# Patient Record
Sex: Female | Born: 1960 | Race: White | Hispanic: No | Marital: Married | Smoking: Former smoker
Health system: Southern US, Community
[De-identification: ages and names within clinical notes are randomized; demographics above are authoritative.]

## PROBLEM LIST (undated history)

## (undated) DIAGNOSIS — I10 Essential (primary) hypertension: Secondary | ICD-10-CM

## (undated) DIAGNOSIS — E079 Disorder of thyroid, unspecified: Secondary | ICD-10-CM

## (undated) DIAGNOSIS — K219 Gastro-esophageal reflux disease without esophagitis: Secondary | ICD-10-CM

## (undated) DIAGNOSIS — D649 Anemia, unspecified: Secondary | ICD-10-CM

## (undated) DIAGNOSIS — N6019 Diffuse cystic mastopathy of unspecified breast: Secondary | ICD-10-CM

## (undated) DIAGNOSIS — M8580 Other specified disorders of bone density and structure, unspecified site: Secondary | ICD-10-CM

## (undated) DIAGNOSIS — H269 Unspecified cataract: Secondary | ICD-10-CM

## (undated) DIAGNOSIS — T7840XA Allergy, unspecified, initial encounter: Secondary | ICD-10-CM

## (undated) DIAGNOSIS — M5090 Cervical disc disorder, unspecified, unspecified cervical region: Secondary | ICD-10-CM

## (undated) DIAGNOSIS — K635 Polyp of colon: Secondary | ICD-10-CM

## (undated) HISTORY — DX: Cervical disc disorder, unspecified, unspecified cervical region: M50.90

## (undated) HISTORY — DX: Gastro-esophageal reflux disease without esophagitis: K21.9

## (undated) HISTORY — DX: Disorder of thyroid, unspecified: E07.9

## (undated) HISTORY — DX: Anemia, unspecified: D64.9

## (undated) HISTORY — PX: CATARACT EXTRACTION, BILATERAL: SHX1313

## (undated) HISTORY — DX: Diffuse cystic mastopathy of unspecified breast: N60.19

## (undated) HISTORY — PX: COLONOSCOPY: SHX174

## (undated) HISTORY — DX: Polyp of colon: K63.5

## (undated) HISTORY — PX: MANDIBLE SURGERY: SHX707

## (undated) HISTORY — DX: Other specified disorders of bone density and structure, unspecified site: M85.80

## (undated) HISTORY — DX: Essential (primary) hypertension: I10

## (undated) HISTORY — DX: Allergy, unspecified, initial encounter: T78.40XA

## (undated) HISTORY — DX: Unspecified cataract: H26.9

## (undated) HISTORY — PX: POLYPECTOMY: SHX149

---

## 1997-08-17 ENCOUNTER — Other Ambulatory Visit: Admission: RE | Admit: 1997-08-17 | Discharge: 1997-08-17 | Payer: Self-pay | Admitting: Obstetrics and Gynecology

## 1998-06-15 ENCOUNTER — Ambulatory Visit (HOSPITAL_COMMUNITY): Admission: RE | Admit: 1998-06-15 | Discharge: 1998-06-15 | Payer: Self-pay | Admitting: Obstetrics and Gynecology

## 1998-06-15 ENCOUNTER — Encounter: Payer: Self-pay | Admitting: Obstetrics and Gynecology

## 1998-08-18 ENCOUNTER — Other Ambulatory Visit: Admission: RE | Admit: 1998-08-18 | Discharge: 1998-08-18 | Payer: Self-pay | Admitting: Obstetrics and Gynecology

## 1999-08-07 ENCOUNTER — Other Ambulatory Visit: Admission: RE | Admit: 1999-08-07 | Discharge: 1999-08-07 | Payer: Self-pay | Admitting: Obstetrics and Gynecology

## 2000-02-19 ENCOUNTER — Inpatient Hospital Stay (HOSPITAL_COMMUNITY): Admission: AD | Admit: 2000-02-19 | Discharge: 2000-02-22 | Payer: Self-pay | Admitting: Obstetrics and Gynecology

## 2000-02-23 ENCOUNTER — Encounter: Admission: RE | Admit: 2000-02-23 | Discharge: 2000-03-21 | Payer: Self-pay | Admitting: Obstetrics and Gynecology

## 2000-02-26 ENCOUNTER — Observation Stay (HOSPITAL_COMMUNITY): Admission: AD | Admit: 2000-02-26 | Discharge: 2000-02-27 | Payer: Self-pay | Admitting: Obstetrics and Gynecology

## 2000-03-27 ENCOUNTER — Other Ambulatory Visit: Admission: RE | Admit: 2000-03-27 | Discharge: 2000-03-27 | Payer: Self-pay | Admitting: Obstetrics and Gynecology

## 2001-02-14 ENCOUNTER — Other Ambulatory Visit: Admission: RE | Admit: 2001-02-14 | Discharge: 2001-02-14 | Payer: Self-pay | Admitting: Obstetrics and Gynecology

## 2001-09-14 ENCOUNTER — Inpatient Hospital Stay (HOSPITAL_COMMUNITY): Admission: AD | Admit: 2001-09-14 | Discharge: 2001-09-19 | Payer: Self-pay | Admitting: Obstetrics and Gynecology

## 2001-09-14 ENCOUNTER — Encounter (INDEPENDENT_AMBULATORY_CARE_PROVIDER_SITE_OTHER): Payer: Self-pay | Admitting: *Deleted

## 2001-10-20 ENCOUNTER — Other Ambulatory Visit: Admission: RE | Admit: 2001-10-20 | Discharge: 2001-10-20 | Payer: Self-pay | Admitting: Obstetrics and Gynecology

## 2002-11-18 ENCOUNTER — Other Ambulatory Visit: Admission: RE | Admit: 2002-11-18 | Discharge: 2002-11-18 | Payer: Self-pay | Admitting: Obstetrics and Gynecology

## 2003-03-26 ENCOUNTER — Encounter: Admission: RE | Admit: 2003-03-26 | Discharge: 2003-03-26 | Payer: Self-pay | Admitting: Obstetrics and Gynecology

## 2003-12-16 ENCOUNTER — Other Ambulatory Visit: Admission: RE | Admit: 2003-12-16 | Discharge: 2003-12-16 | Payer: Self-pay | Admitting: Obstetrics and Gynecology

## 2004-02-10 ENCOUNTER — Ambulatory Visit: Payer: Self-pay | Admitting: Family Medicine

## 2004-02-11 ENCOUNTER — Ambulatory Visit: Payer: Self-pay | Admitting: Family Medicine

## 2004-02-15 ENCOUNTER — Ambulatory Visit: Payer: Self-pay | Admitting: Family Medicine

## 2004-03-16 ENCOUNTER — Ambulatory Visit: Payer: Self-pay | Admitting: Family Medicine

## 2004-03-23 ENCOUNTER — Ambulatory Visit: Payer: Self-pay | Admitting: Family Medicine

## 2004-03-23 ENCOUNTER — Encounter: Admission: RE | Admit: 2004-03-23 | Discharge: 2004-03-23 | Payer: Self-pay | Admitting: Family Medicine

## 2004-03-30 ENCOUNTER — Ambulatory Visit (HOSPITAL_COMMUNITY): Admission: RE | Admit: 2004-03-30 | Discharge: 2004-03-30 | Payer: Self-pay | Admitting: Obstetrics and Gynecology

## 2004-04-06 ENCOUNTER — Ambulatory Visit: Payer: Self-pay | Admitting: Family Medicine

## 2004-04-21 ENCOUNTER — Encounter: Admission: RE | Admit: 2004-04-21 | Discharge: 2004-04-21 | Payer: Self-pay | Admitting: Family Medicine

## 2004-04-21 ENCOUNTER — Ambulatory Visit: Payer: Self-pay | Admitting: Family Medicine

## 2004-04-24 ENCOUNTER — Ambulatory Visit: Payer: Self-pay | Admitting: Family Medicine

## 2004-04-26 ENCOUNTER — Ambulatory Visit: Payer: Self-pay | Admitting: Internal Medicine

## 2004-04-28 ENCOUNTER — Ambulatory Visit: Payer: Self-pay | Admitting: Family Medicine

## 2004-05-25 ENCOUNTER — Ambulatory Visit: Payer: Self-pay | Admitting: Internal Medicine

## 2004-07-05 ENCOUNTER — Ambulatory Visit: Payer: Self-pay | Admitting: Family Medicine

## 2004-07-20 ENCOUNTER — Ambulatory Visit: Payer: Self-pay | Admitting: Internal Medicine

## 2004-08-04 ENCOUNTER — Ambulatory Visit: Payer: Self-pay | Admitting: Internal Medicine

## 2004-08-08 ENCOUNTER — Ambulatory Visit: Payer: Self-pay | Admitting: Internal Medicine

## 2004-08-18 ENCOUNTER — Ambulatory Visit: Payer: Self-pay | Admitting: Family Medicine

## 2004-08-25 ENCOUNTER — Ambulatory Visit: Payer: Self-pay | Admitting: Internal Medicine

## 2004-08-25 ENCOUNTER — Encounter (INDEPENDENT_AMBULATORY_CARE_PROVIDER_SITE_OTHER): Payer: Self-pay | Admitting: *Deleted

## 2004-09-18 ENCOUNTER — Ambulatory Visit: Payer: Self-pay | Admitting: Family Medicine

## 2004-11-23 ENCOUNTER — Ambulatory Visit: Payer: Self-pay | Admitting: Internal Medicine

## 2004-12-22 ENCOUNTER — Ambulatory Visit: Payer: Self-pay | Admitting: Family Medicine

## 2005-01-31 ENCOUNTER — Other Ambulatory Visit: Admission: RE | Admit: 2005-01-31 | Discharge: 2005-01-31 | Payer: Self-pay | Admitting: Obstetrics and Gynecology

## 2005-04-19 ENCOUNTER — Encounter: Admission: RE | Admit: 2005-04-19 | Discharge: 2005-04-19 | Payer: Self-pay | Admitting: Obstetrics and Gynecology

## 2005-04-20 ENCOUNTER — Ambulatory Visit: Payer: Self-pay | Admitting: Internal Medicine

## 2005-08-16 ENCOUNTER — Ambulatory Visit: Payer: Self-pay | Admitting: Internal Medicine

## 2006-02-15 ENCOUNTER — Ambulatory Visit: Payer: Self-pay | Admitting: Internal Medicine

## 2006-04-09 HISTORY — PX: BLADDER SUSPENSION: SHX72

## 2006-04-09 HISTORY — PX: TUBAL LIGATION: SHX77

## 2006-04-23 ENCOUNTER — Encounter: Admission: RE | Admit: 2006-04-23 | Discharge: 2006-04-23 | Payer: Self-pay | Admitting: Obstetrics and Gynecology

## 2006-06-03 ENCOUNTER — Ambulatory Visit: Payer: Self-pay | Admitting: Family Medicine

## 2006-08-11 ENCOUNTER — Emergency Department (HOSPITAL_COMMUNITY): Admission: EM | Admit: 2006-08-11 | Discharge: 2006-08-11 | Payer: Self-pay | Admitting: Emergency Medicine

## 2006-08-14 ENCOUNTER — Ambulatory Visit: Payer: Self-pay | Admitting: Family Medicine

## 2006-08-14 LAB — CONVERTED CEMR LAB
BUN: 15 mg/dL (ref 6–23)
Bilirubin, Direct: 0.1 mg/dL (ref 0.0–0.3)
Chloride: 111 meq/L (ref 96–112)
GFR calc Af Amer: 100 mL/min
GFR calc non Af Amer: 83 mL/min
Glucose, Bld: 69 mg/dL — ABNORMAL LOW (ref 70–99)
HCT: 36.9 % (ref 36.0–46.0)
MCHC: 34 g/dL (ref 30.0–36.0)
MCV: 85.6 fL (ref 78.0–100.0)
Neutrophils Relative %: 77.8 % — ABNORMAL HIGH (ref 43.0–77.0)
Platelets: 352 10*3/uL (ref 150–400)
Potassium: 3.5 meq/L (ref 3.5–5.1)
RBC: 4.31 M/uL (ref 3.87–5.11)
RDW: 14.1 % (ref 11.5–14.6)
Sodium: 143 meq/L (ref 135–145)
Total Protein: 6.3 g/dL (ref 6.0–8.3)

## 2007-01-06 DIAGNOSIS — Z860100 Personal history of colon polyps, unspecified: Secondary | ICD-10-CM | POA: Insufficient documentation

## 2007-01-06 DIAGNOSIS — K219 Gastro-esophageal reflux disease without esophagitis: Secondary | ICD-10-CM | POA: Insufficient documentation

## 2007-01-06 DIAGNOSIS — J45909 Unspecified asthma, uncomplicated: Secondary | ICD-10-CM

## 2007-01-06 DIAGNOSIS — J309 Allergic rhinitis, unspecified: Secondary | ICD-10-CM | POA: Insufficient documentation

## 2007-02-14 ENCOUNTER — Ambulatory Visit: Payer: Self-pay | Admitting: Internal Medicine

## 2007-02-18 DIAGNOSIS — J329 Chronic sinusitis, unspecified: Secondary | ICD-10-CM | POA: Insufficient documentation

## 2007-02-18 DIAGNOSIS — H1045 Other chronic allergic conjunctivitis: Secondary | ICD-10-CM | POA: Insufficient documentation

## 2007-02-18 DIAGNOSIS — J209 Acute bronchitis, unspecified: Secondary | ICD-10-CM

## 2007-04-17 ENCOUNTER — Ambulatory Visit (HOSPITAL_COMMUNITY): Admission: RE | Admit: 2007-04-17 | Discharge: 2007-04-17 | Payer: Self-pay | Admitting: Obstetrics and Gynecology

## 2007-04-25 ENCOUNTER — Encounter: Admission: RE | Admit: 2007-04-25 | Discharge: 2007-04-25 | Payer: Self-pay | Admitting: Obstetrics and Gynecology

## 2007-05-08 ENCOUNTER — Ambulatory Visit: Payer: Self-pay | Admitting: Internal Medicine

## 2007-09-18 ENCOUNTER — Telehealth: Payer: Self-pay | Admitting: *Deleted

## 2007-11-11 ENCOUNTER — Ambulatory Visit: Payer: Self-pay | Admitting: Family Medicine

## 2007-11-11 LAB — CONVERTED CEMR LAB
AST: 16 units/L (ref 0–37)
BUN: 14 mg/dL (ref 6–23)
Basophils Absolute: 0 10*3/uL (ref 0.0–0.1)
Basophils Relative: 0.3 % (ref 0.0–3.0)
Eosinophils Absolute: 0.3 10*3/uL (ref 0.0–0.7)
GFR calc non Af Amer: 82 mL/min
Glucose, Urine, Semiquant: NEGATIVE
HCT: 35.3 % — ABNORMAL LOW (ref 36.0–46.0)
HDL: 68 mg/dL (ref >39.0)
Hemoglobin: 12.2 g/dL (ref 12.0–15.0)
Ketones, urine, test strip: NEGATIVE
Monocytes Absolute: 0.3 10*3/uL (ref 0.1–1.0)
Neutro Abs: 3.3 10*3/uL (ref 1.4–7.7)
Neutrophils Relative %: 65.9 % (ref 43.0–77.0)
Platelets: 297 10*3/uL (ref 150–400)
TSH: 5.16 microintl units/mL (ref 0.35–5.50)
Total Bilirubin: 0.8 mg/dL (ref 0.3–1.2)
Total CHOL/HDL Ratio: 3.3
Triglycerides: 67 mg/dL (ref 0–149)
Urobilinogen, UA: 1
VLDL: 13 mg/dL (ref 0–40)
WBC: 5.1 10*3/uL (ref 4.5–10.5)

## 2007-11-19 ENCOUNTER — Ambulatory Visit: Payer: Self-pay | Admitting: Family Medicine

## 2007-11-19 DIAGNOSIS — R5381 Other malaise: Secondary | ICD-10-CM

## 2007-11-19 DIAGNOSIS — R5383 Other fatigue: Secondary | ICD-10-CM

## 2007-11-19 DIAGNOSIS — N949 Unspecified condition associated with female genital organs and menstrual cycle: Secondary | ICD-10-CM | POA: Insufficient documentation

## 2007-11-19 DIAGNOSIS — M5020 Other cervical disc displacement, unspecified cervical region: Secondary | ICD-10-CM

## 2007-12-16 ENCOUNTER — Ambulatory Visit: Payer: Self-pay | Admitting: Family Medicine

## 2008-01-02 ENCOUNTER — Ambulatory Visit: Payer: Self-pay | Admitting: Internal Medicine

## 2008-01-20 ENCOUNTER — Ambulatory Visit: Payer: Self-pay | Admitting: Family Medicine

## 2008-02-13 ENCOUNTER — Telehealth (INDEPENDENT_AMBULATORY_CARE_PROVIDER_SITE_OTHER): Payer: Self-pay | Admitting: *Deleted

## 2008-03-26 ENCOUNTER — Telehealth (INDEPENDENT_AMBULATORY_CARE_PROVIDER_SITE_OTHER): Payer: Self-pay | Admitting: *Deleted

## 2008-04-27 ENCOUNTER — Encounter: Admission: RE | Admit: 2008-04-27 | Discharge: 2008-04-27 | Payer: Self-pay | Admitting: Obstetrics and Gynecology

## 2008-06-28 ENCOUNTER — Ambulatory Visit: Payer: Self-pay | Admitting: Family Medicine

## 2008-06-28 DIAGNOSIS — R21 Rash and other nonspecific skin eruption: Secondary | ICD-10-CM

## 2008-07-01 ENCOUNTER — Telehealth: Payer: Self-pay | Admitting: Family Medicine

## 2008-07-08 ENCOUNTER — Telehealth (INDEPENDENT_AMBULATORY_CARE_PROVIDER_SITE_OTHER): Payer: Self-pay | Admitting: *Deleted

## 2008-07-12 ENCOUNTER — Encounter: Payer: Self-pay | Admitting: Internal Medicine

## 2008-12-17 ENCOUNTER — Ambulatory Visit: Payer: Self-pay | Admitting: Family Medicine

## 2008-12-17 DIAGNOSIS — R51 Headache: Secondary | ICD-10-CM

## 2008-12-17 DIAGNOSIS — Z780 Asymptomatic menopausal state: Secondary | ICD-10-CM | POA: Insufficient documentation

## 2008-12-22 ENCOUNTER — Ambulatory Visit: Payer: Self-pay | Admitting: Family Medicine

## 2008-12-29 ENCOUNTER — Ambulatory Visit: Payer: Self-pay | Admitting: Internal Medicine

## 2009-01-24 ENCOUNTER — Telehealth: Payer: Self-pay | Admitting: Family Medicine

## 2009-04-04 ENCOUNTER — Telehealth: Payer: Self-pay | Admitting: Family Medicine

## 2009-04-28 ENCOUNTER — Encounter: Admission: RE | Admit: 2009-04-28 | Discharge: 2009-04-28 | Payer: Self-pay | Admitting: Obstetrics and Gynecology

## 2009-07-20 ENCOUNTER — Encounter (INDEPENDENT_AMBULATORY_CARE_PROVIDER_SITE_OTHER): Payer: Self-pay | Admitting: *Deleted

## 2009-11-08 ENCOUNTER — Telehealth (INDEPENDENT_AMBULATORY_CARE_PROVIDER_SITE_OTHER): Payer: Self-pay | Admitting: *Deleted

## 2009-12-28 ENCOUNTER — Ambulatory Visit: Payer: Self-pay | Admitting: Internal Medicine

## 2010-01-17 ENCOUNTER — Telehealth (INDEPENDENT_AMBULATORY_CARE_PROVIDER_SITE_OTHER): Payer: Self-pay | Admitting: *Deleted

## 2010-01-23 ENCOUNTER — Telehealth: Payer: Self-pay | Admitting: Internal Medicine

## 2010-03-16 ENCOUNTER — Ambulatory Visit: Payer: Self-pay | Admitting: Family Medicine

## 2010-03-20 ENCOUNTER — Telehealth: Payer: Self-pay | Admitting: Family Medicine

## 2010-03-20 DIAGNOSIS — L820 Inflamed seborrheic keratosis: Secondary | ICD-10-CM | POA: Insufficient documentation

## 2010-05-02 ENCOUNTER — Encounter
Admission: RE | Admit: 2010-05-02 | Discharge: 2010-05-02 | Payer: Self-pay | Source: Home / Self Care | Attending: Obstetrics and Gynecology | Admitting: Obstetrics and Gynecology

## 2010-05-05 ENCOUNTER — Telehealth (INDEPENDENT_AMBULATORY_CARE_PROVIDER_SITE_OTHER): Payer: Self-pay | Admitting: *Deleted

## 2010-05-11 NOTE — Assessment & Plan Note (Signed)
Summary: rov 1 yr ///kp   Primary Provider/Referring Provider:  Alonza Smoker  CC:  Yearly follow up visit-allergic rhinitis and asthma.Sneezing and eyes itching.Marland Kitchen  History of Present Illness: 01/02/08-50 yo woman returning for f/u of allergic rhinitis, conjunctivitis and asthma. One year f/u. Did well through spring and summer until she mowed her yard 12/09/07. Hacking vcough since then, progressively worse. Husband sleeping on couch to get some sleep because of her cough through  night. Was on prednisone 8/09 for cervical disk. Cough nonproductive. wheeze partly relieved by  eb 2-3 x daily. Denies sore throat, fever, purulent or pain. Minor nasal drg- clear.  12/29/08- Allergic rhinoconjunctivitis, asthma Sitting outside a lot watching son's ballgames. Has done very well across the past year, taking cetirizine most days and adding an occasional claritin. Sometimes uses Astlin.  Asthma- Does well using Advair 500 once daily during allery seasons.  December 28, 2009- Allergic rhinoconjunctivitis, asthma Still outdoor exposures to child's ballgames. Seasonal exposure does matter. She is walking 5 miles, 4 days/ week since laid off from work. A short time after ending her walk she will sneeze and blow for a while. She still uses Astelin, cetirizine and they do help. She is also caring for brother's dog and that contact makes her wheeze. She continues Singulair but not Advair. She has had the 500 strength. We discussed maintenance use. Infrequent rescue inhaler use. Uses occasional claritn D. Flu vax discused. Now has 2 cats, 2 [place] pigs, and her brother's dog.    Asthma History    Initial Asthma Severity Rating:    Age range: 12+ years    Symptoms: 0-2 days/week    Nighttime Awakenings: 0-2/month    Interferes w/ normal activity: no limitations    SABA use (not for EIB): 0-2 days/week    Asthma Severity Assessment: Intermittent   Preventive Screening-Counseling &  Management  Alcohol-Tobacco     Smoking Status: quit     Year Quit: 25 yrs ago     Pack years: 4 yrs, socially  Current Medications (verified): 1)  Singulair 10 Mg  Tabs (Montelukast Sodium) .... Take 1 Tab By Mouth At Bedtime 2)  Cetirizine Hcl 10 Mg Tabs (Cetirizine Hcl) .... Take 1 Tablet By Mouth Once A Day 3)  Astelin 137 Mcg/spray  Soln (Azelastine Hcl) .... As Needed 4)  Proventil Hfa 108 (90 Base) Mcg/act Aers (Albuterol Sulfate) .... 2 Puffs  Four Times A Day As Needed 5)  Advair Diskus 500-50 Mcg/dose  Misc (Fluticasone-Salmeterol) .... Inhale 1 Puff Once A Day 6)  Celexa 20 Mg  Tabs (Citalopram Hydrobromide) .Marland Kitchen.. 1 Tab @ Bedtime 7)  Lidex 0.05 % Crea (Fluocinonide) .... Apply Two Times A Day 8)  Topamax 25 Mg Tabs (Topiramate) .... Take 1 Tablet By Mouth Two Times A Day 9)  Zovia 1/35e (28) 1-35 Mg-Mcg Tabs (Ethynodiol Diac-Eth Estradiol) .... Uad  Allergies (verified): 1)  ! Codeine 2)  ! * Avelox  Past History:  Past Medical History: Last updated: 11/19/2007 Asthma Colonic polyps, hx of GERD Allergic rhinitis addprobs fibrocystic breast changes cervical disk disease status post BTL with bladder suspension, January 2009  Past Surgical History: Last updated: 01/06/2007 Jaw Surgery  Family History: Last updated: 01/06/2007 Family History of Asthma Family History of Allergies  Social History: Last updated: 12/28/2009 Occupation: Married Psychiatrist- laid off Former Smoker  Risk Factors: Smoking Status: quit (12/28/2009)  Social History: Occupation: Married Psychiatrist- laid off Former Smoker  Review of Systems  See HPI       The patient complains of shortness of breath with activity, non-productive cough, nasal congestion/difficulty breathing through nose, and sneezing.  The patient denies shortness of breath at rest, productive cough, coughing up blood, chest pain, irregular heartbeats, acid heartburn, indigestion, loss of  appetite, weight change, abdominal pain, difficulty swallowing, sore throat, tooth/dental problems, headaches, itching, ear ache, rash, and change in color of mucus.    Vital Signs:  Patient profile:   50 year old female Height:      67 inches Weight:      162.38 pounds BMI:     25.52 O2 Sat:      96 % on Room air Pulse rate:   77 / minute BP sitting:   124 / 80  (right arm) Cuff size:   regular  Vitals Entered By: Reynaldo Minium CMA (December 28, 2009 10:37 AM)  O2 Flow:  Room air CC: Yearly follow up visit-allergic rhinitis and asthma.Sneezing and eyes itching.   Physical Exam  Additional Exam:  General: A/Ox3; pleasant and cooperative, NAD, SKIN: no rash, lesions NODES: no lymphadenopathy HEENT: [place]/AT, EOM- WNL, Conjuctivae- clear, contact lenses PERRLA, TM-WNL, Nose- clear, Throat- clear and wnl, Mallampati  II NECK: Supple w/ fair ROM, JVD- none, normal carotid impulses w/o bruits Thyroid- normal to palpation CHEST: Clear to P&A wit no wheeze or cough HEART: RRR, no m/g/r heard ABDOMEN: Soft and nl;  XBJ:YNWG, nl pulses, no edema  NEURO: Grossly intact to observation,       Impression & Recommendations:  Problem # 1:  ALLERGIC RHINITIS (ICD-477.9) Rhinitis is triggered by sustained outdoor exposure. She had tried allergy vaccine in past. Antihisitamines should be sufficient, but we can add a nasal steroid if needed. Her updated medication list for this problem includes:    Cetirizine Hcl 10 Mg Tabs (Cetirizine hcl) .Marland Kitchen... Take 1 tablet by mouth once a day    Astelin 137 Mcg/spray Soln (Azelastine hcl) .Marland Kitchen... As needed    Afrin Nasal Spray 0.05 % Soln (Oxymetazoline hcl) .Marland Kitchen... 1-2 sprays each nostril up to twice daily if needed for occasional use only  Problem # 2:  ASTHMA (ICD-493.90) Good control. Breakthrough mostly with direct exposure to her brother's dog, which will hopefully end soon.We have reviewed environmental precautions. We reviewed use of controller vs  rescue meds.  Problem # 3:  ALLERGIC CONJUNCTIVITIS (ICD-372.14)  Orders: Est. Patient Level IV (95621)  Medications Added to Medication List This Visit: 1)  Afrin Nasal Spray 0.05 % Soln (Oxymetazoline hcl) .Marland Kitchen.. 1-2 sprays each nostril up to twice daily if needed for occasional use only 2)  Claritin-d 12 Hour 5-120 Mg Xr12h-tab (Loratadine-pseudoephedrine) .Marland Kitchen.. 1 two times a day as needed 3)  Advair Diskus 100-50 Mcg/dose Aepb (Fluticasone-salmeterol) .Marland Kitchen.. 1 puff and rinse mouth, twice daily maintenance  Other Orders: Admin 1st Vaccine (30865) Flu Vaccine 41yrs + (78469)  Patient Instructions: 1)  Please schedule a follow-up appointment in 1 year. 2)  Meds refilled. While the exposures are bothering you, I suggest you use the Advair regularly, at least once daily. 3)  Flu vax Prescriptions: PROVENTIL HFA 108 (90 BASE) MCG/ACT AERS (ALBUTEROL SULFATE) 2 puffs  four times a day as needed  #3 x 3   Entered and Authorized by:   Waymon Budge MD   Signed by:   Waymon Budge MD on 12/28/2009   Method used:   Print then Give to Patient   RxID:   [id number] ASTELIN 137  MCG/SPRAY  SOLN (AZELASTINE HCL) as needed  #3 x 3   Entered and Authorized by:   Waymon Budge MD   Signed by:   Waymon Budge MD on 12/28/2009   Method used:   Print then Give to Patient   RxID:   [id number] CETIRIZINE HCL 10 MG TABS (CETIRIZINE HCL) Take 1 tablet by mouth once a day  #90 x 3   Entered and Authorized by:   Waymon Budge MD   Signed by:   Waymon Budge MD on 12/28/2009   Method used:   Print then Give to Patient   RxID:   [id number] ADVAIR DISKUS 100-50 MCG/DOSE AEPB (FLUTICASONE-SALMETEROL) 1 puff and rinse mouth, twice daily maintenance  #3 x 3   Entered and Authorized by:   Waymon Budge MD   Signed by:   Waymon Budge MD on 12/28/2009   Method used:   Print then Give to Patient   RxID:   [id number] CLARITIN-D 12 HOUR 5-120 MG XR12H-TAB  (LORATADINE-PSEUDOEPHEDRINE) 1 two times a day as needed  #180 x 3   Entered and Authorized by:   Waymon Budge MD   Signed by:   Waymon Budge MD on 12/28/2009   Method used:   Print then Give to Patient   RxID:   [id number] AFRIN NASAL SPRAY 0.05 % SOLN (OXYMETAZOLINE HCL) 1-2 sprays each nostril up to twice daily if needed for occasional use only  #3 x 3   Entered and Authorized by:   Waymon Budge MD   Signed by:   Waymon Budge MD on 12/28/2009   Method used:   Print then Give to Patient   RxID:   [id number]          Flu Vaccine Consent Questions     Do you have a history of severe allergic reactions to this vaccine? no    Any prior history of allergic reactions to egg and/or gelatin? no    Do you have a sensitivity to the preservative Thimersol? no    Do you have a past history of Guillan-Barre Syndrome? no    Do you currently have an acute febrile illness? no    Have you ever had a severe reaction to latex? no    Vaccine information given and explained to patient? yes    Are you currently pregnant? no    Lot Number:[id number]   Exp Date:10/07/2010   Site Given  Left Deltoid IMbflu Gweneth Dimitri RN  December 28, 2009 11:16 AM

## 2010-05-11 NOTE — Progress Notes (Signed)
  Phone Note Outgoing Call   Summary of Call: I called  to review the report and left a message irritated seborrheic keratosis benign Initial call taken by: Roderick Pee MD,  March 20, 2010 5:55 PM

## 2010-05-11 NOTE — Progress Notes (Signed)
Summary: PRESCRIPT  Phone Note Call from Patient   Caller: Patient Call For: YOUNG Summary of Call: NEED ADVAIR PROVENTIL AND ASTELIN FAXED TO [facility]  AT [phone] Initial call taken by: Rickard Patience,  January 17, 2010 11:17 AM  Follow-up for Phone Call        Called, spoke with pt.  She states she mailed all the rxs that were given to her at last OV to [facility].  States she called [facility] and the lady she spoke with was "confused" bc she also sent in rxs for OTC meds bc she is on the flexable spending account and needs rxs for OTC meds inorder to have them paid for.  States the lady told her that they couldn't fill the rxs.  She cuoldn't remember the name of the lady whom she spoke with.  I called [facility], spoke with Amanda-pharmacist.  She states she is not showing  the rxs in there sxs and not showing any notes in the system as to why theses rxs could not be filled.  Advised I would fax these rxs in.    Rxs faxed to [facility] -- pt aware.   Follow-up by: Gweneth Dimitri RN,  January 17, 2010 11:51 AM    Prescriptions: ADVAIR DISKUS 100-50 MCG/DOSE AEPB (FLUTICASONE-SALMETEROL) 1 puff and rinse mouth, twice daily maintenance  #3 x 3   Entered by:   Gweneth Dimitri RN   Authorized by:   Waymon Budge MD   Signed by:   Gweneth Dimitri RN on 01/17/2010   Method used:   Faxed to ...       [facility] (mail-order)             , [place]         Ph: [phone]       Fax: [phone]   RxID:   [id number] PROVENTIL HFA 108 (90 BASE) MCG/ACT AERS (ALBUTEROL SULFATE) 2 puffs  four times a day as needed  #3 x 3   Entered by:   Gweneth Dimitri RN   Authorized by:   Waymon Budge MD   Signed by:   Gweneth Dimitri RN on 01/17/2010   Method used:   Faxed to ...       [facility] (mail-order)             , [place]         Ph: [phone]       Fax: [phone]   RxID:   [id number] ASTELIN 137 MCG/SPRAY  SOLN (AZELASTINE HCL) as needed  #3 x 3   Entered by:   Gweneth Dimitri RN   Authorized by:   Waymon Budge MD   Signed by:   Gweneth Dimitri RN on 01/17/2010   Method used:   Faxed to ...       [facility] (mail-order)             , [place]         Ph: [phone]       Fax: [phone]   RxID:   [id number]

## 2010-05-11 NOTE — Progress Notes (Signed)
Summary: singulair to [facility]  Phone Note Call from Patient Call back at [facility] Phone [phone]   Caller: Patient Call For: young Reason for Call: Refill Medication Summary of Call: Needs a written rx for singulair 10mg , need a year's supply with 66-month increments. Will pick up. Initial call taken by: Darletta Moll,  November 08, 2009 4:01 PM  Follow-up for Phone Call        last ov w/ CDY 9.22.10, upcoming 9.21.11.  this appears to be for mail-order and [facility] is in her chart.  would she like [place] to send this electronically so she won't have to come pick this up?  called spoke with patient who requested that her singulair script indeed be sent electronically to [facility].  advised pt to keep her upcoming appt in Sept, which she stated she will. Follow-up by: Boone Master CNA/MA,  November 08, 2009 4:15 PM    Prescriptions: SINGULAIR 10 MG  TABS (MONTELUKAST SODIUM) Take 1 tab by mouth at bedtime  #90 x 4   Entered by:   Boone Master CNA/MA   Authorized by:   Waymon Budge MD   Signed by:   Boone Master CNA/MA on 11/08/2009   Method used:   Electronically to        [facility]* (retail)             ,          Ph: [phone]       Fax: [phone]   RxID:   [id number]

## 2010-05-11 NOTE — Assessment & Plan Note (Signed)
Summary: MOLE REMOVAL? // RS   Procedure Note Last Tetanus: Historical (04/09/2001)  Mole Biopsy/Removal: Indication: rule out cancer Consent signed: yes  Procedure # 1: elliptical incision with 2 mm margin    Size (in cm): 1.0 x 1.0    Region: anterior    Location: back-upper-left    Instrument used: #15 blade    Anesthesia: 1% lidocaine w/epinephrine    Closure: cautery  Cleaned and prepped with: alcohol Wound dressing: bandaid   Primary Care Provider:  Alonza Smoker   History of Present Illness: [name] [name] is a 50 year old female comes in today because of an irritated lesion on her back.  She is noticed this over the last couple weeks.  It's also become irritated and itching and increasing in size.  Allergies: 1)  ! Codeine 2)  ! * Avelox   Complete Medication List: 1)  Singulair 10 Mg Tabs (Montelukast sodium) .... Take 1 tab by mouth at bedtime 2)  Cetirizine Hcl 10 Mg Tabs (Cetirizine hcl) .... Take 1 tablet by mouth once a day 3)  Astelin 137 Mcg/spray Soln (Azelastine hcl) .... As needed 4)  Proventil Hfa 108 (90 Base) Mcg/act Aers (Albuterol sulfate) .... 2 puffs  four times a day as needed 5)  Advair Diskus 500-50 Mcg/dose Misc (Fluticasone-salmeterol) .... Inhale 1 puff once a day 6)  Celexa 20 Mg Tabs (Citalopram hydrobromide) .Marland Kitchen.. 1 tab @ bedtime 7)  Lidex 0.05 % Crea (Fluocinonide) .... Apply two times a day 8)  Topamax 25 Mg Tabs (Topiramate) .... Take 1 tablet by mouth two times a day 9)  Zovia 1/35e (28) 1-35 Mg-mcg Tabs (Ethynodiol diac-eth estradiol) .... Uad 10)  Afrin Nasal Spray 0.05 % Soln (Oxymetazoline hcl) .Marland Kitchen.. 1-2 sprays each nostril up to twice daily if needed for occasional use only 11)  Claritin-d 12 Hour 5-120 Mg Xr12h-tab (Loratadine-pseudoephedrine) .Marland Kitchen.. 1 two times a day as needed 12)  Advair Diskus 100-50 Mcg/dose Aepb (Fluticasone-salmeterol) .Marland Kitchen.. 1 puff and rinse mouth, twice daily maintenance  Other Orders: Shave Skin Lesion 0.6-1.0  cm/trunk/arm/leg (11301)   Orders Added: 1)  Shave Skin Lesion 0.6-1.0 cm/trunk/arm/leg [11301]

## 2010-05-11 NOTE — Progress Notes (Signed)
Summary: PRESCRIPTION  ? zyrtec and claritin  Phone Note From Pharmacy Call back at [phone]   [id number] Call For: young  Summary of Call: would like 365 count for zytec and change clairitin D 12 hour  to 24 hour Initial call taken by: Rickard Patience,  January 23, 2010 4:05 PM  Follow-up for Phone Call        [facility].  She states they received rx for Zyrtec and Claritin D 12 hr.  However, Denny Peon states they have a [facility] of Zyrtec for 365 count that pt can get for cheaper (which pt would like to do) Please advise if ok to give #365 x 0 refills for the Zyrtec.    Also, Denny Peon states they have a [facility] of the Claritin D 24 hour that would be cheaper for pt and pt requested they call to see if [place] would ok to change claritin D 12 hour to the 24 hour.  Please advise.   Thank you.  Arman Filter LPN  January 23, 2010 4:48 PM  Follow-up by: Waymon Budge MD,  January 23, 2010 8:49 PM  Additional Follow-up for Phone Call Additional follow up Details #1::        OK- thanks Additional Follow-up by: Waymon Budge MD,  January 23, 2010 8:49 PM    Additional Follow-up for Phone Call Additional follow up Details #2::    Spoke with pharmacists and advised ok to make changes. Carron Curie CMA  January 24, 2010 9:05 AM

## 2010-05-11 NOTE — Letter (Signed)
Summary: Colonoscopy Letter  [facility]  [address] [place], [place] [address]   Phone: [phone]  Fax: [phone]      July 20, 2009 MRN: [id number]   [name] [name] [address] [place], [place]  [address]   Dear Ms. BAINS,   According to your medical record, it is time for you to schedule a Colonoscopy. The American Cancer Society recommends this procedure as a method to detect early colon cancer. Patients with a family history of colon cancer, or a personal history of colon polyps or inflammatory bowel disease are at increased risk.  This letter has beeen generated based on the recommendations made at the time of your procedure. If you feel that in your particular situation this may no longer apply, please contact our office.  Please call our office at [phone] to schedule this appointment or to update your records at your earliest convenience.  Thank you for cooperating with [place] to provide you with the very best care possible.   Sincerely,  Hedwig Morton. Juanda Chance, M.D.  [facility] [phone]

## 2010-05-11 NOTE — Progress Notes (Signed)
Summary: singulair refill - LMTCB x 1  Phone Note Call from Patient Call back at [phone]   Caller: Patient Call For: young Reason for Call: Refill Medication, Talk to Nurse Summary of Call: Patient's insurance has changed.  She used to use [facility], but now is needing singulair rx called to local pharmacy.  [facility] Initial call taken by: Lehman Prom,  May 05, 2010 9:38 AM  Follow-up for Phone Call        ? does pt want 1 mo or 3 month [facility] Gweneth Dimitri RN  May 05, 2010 11:07 AM  Spoke with pt and she needs a 3 mth supply.  Pt informed that refill was sent. Abigail Miyamoto RN  May 05, 2010 12:35 PM     Prescriptions: SINGULAIR 10 MG  TABS (MONTELUKAST SODIUM) Take 1 tab by mouth at bedtime  #90 x 4   Entered by:   Abigail Miyamoto RN   Authorized by:   Waymon Budge MD   Signed by:   Abigail Miyamoto RN on 05/05/2010   Method used:   [phone] (retail[facility]       [address]       [place], [place]  [address]       Ph: [phone] or [phone]       Fax: [phone]   RxID:   [id number]

## 2010-05-11 NOTE — Miscellaneous (Signed)
Summary: Consent to Procedure  Consent to Procedure   Imported By: Maryln Gottron 03/22/2010 10:56:23  _____________________________________________________________________  External Attachment:    Type:   Image     Comment:   External Document

## 2010-05-13 ENCOUNTER — Other Ambulatory Visit: Payer: Self-pay | Admitting: Family Medicine

## 2010-05-15 MED ORDER — TOPIRAMATE 25 MG PO TABS: 25.0000 mg | ORAL_TABLET | Freq: Every day | ORAL | Status: DC

## 2010-05-15 NOTE — Telephone Encounter (Signed)
Okay to renew medications until next physical 

## 2010-05-15 NOTE — Telephone Encounter (Signed)
[name] O. K. To renew medication until next physical exam  

## 2010-08-22 NOTE — Assessment & Plan Note (Signed)
[facility]                             PULMONARY OFFICE NOTE   NAME:[name] [name] [name] [name]                         MRN:          [id number]  DATE:02/14/2007                            DOB:          1961/02/16    PROBLEM:  1. Chronic sinusitis.  2. Asthmatic bronchitis.  3. Allergic rhinitis.  4. Allergic conjunctivitis.  5. Esophageal reflux.   HISTORY:  One year follow up. Occasionally wakes with wheeze, but it is  controlled with the medications she has. She uses Astelin before she  goes outside to do yard work and she is satisfied with her current  program needing medication refills today.   MEDICATIONS:  1. Singulair 10 mg.  2. Zyrtec 10 mg.  3. Astelin nasal spray p.r.n.  4. Histinex syrup p.r.n.  5. Birth control pills.  6. Citalopram 10 mg.  7. Advair 250/50.  8. Albuterol rescue inhaler.  9. Claritin-D p.r.n.   She still has a home nebulizer with albuterol, but she has not needed  it.   Drug intolerant AVELOX with erythema nodosum.   OBJECTIVE:  Weight 164 pounds, blood pressure 130/84, pulse 57, room air  saturation 97%. Mucus bridging in both nares. No post-nasal drip or  periorbital edema. Lungs are clear. Heart sounds are regular without  murmur.   IMPRESSION:  Allergic rhinitis, mild intermittent asthma, allergic  conjunctivitis.   PLAN:  1. We have refilled her medications.  2. Flu vaccine discussed and given.  3. Schedule return 1 year, earlier p.r.n.     Clinton D. Maple Hudson, MD, Tonny Bollman, FACP  Electronically Signed    CDY/MedQ  DD: 02/14/2007  DT: 02/16/2007  Job #: [id number]   cc:   Tinnie Gens A. Tawanna Cooler, MD

## 2010-08-22 NOTE — H&P (Signed)
[name] [name], [name] [name]                  ACCOUNT NO.:  [id number]   MEDICAL RECORD NO.:  [id number]          PATIENT TYPE:  AMB   LOCATION:  [facility]                           FACILITY:  [facility]   PHYSICIAN:  Duke Salvia. Marcelle Overlie, M.D.DATE OF BIRTH:  [birth year]   DATE OF ADMISSION:  04/10/2007  DATE OF DISCHARGE:                              HISTORY & PHYSICAL   DATE OF SCHEDULED SURGERIES:  April 10, 2007, at [facility].   CHIEF COMPLAINT:  Requests permanent sterilization, stress urinary  incontinence.   HISTORY OF PRESENT ILLNESS:  A 50 year old. G2. P2. on Necon 1/35.  She  does not plan any further pregnancies, has been bothered by problems  related to stress urinary incontinence over 2 years that have worsened.  Recent evaluation in our office with urodynamics showed minimal PVR.  She did leak with coughing at full capacity, normal uroflow.  LPP  measured 79-108 with normal S1, 2, and 3 noted.  She presents now for  tubal ligation by Filshie clip application and TOT.  The tubal procedure  including the permanence of procedure, failure rate of two to three per  thousand, other risks related to laparoscopy including bleeding,  infection, adjacent organ injury, the possible need for open additional  surgery all reviewed.  Additionally, the TOT procedure including  expectant care rate, risks related to an infection, bleeding and postop  urinary retention all reviewed which she understands.  The procedure  including technical aspects of the procedure, the use of mesh, the small  risk of mesh erosion, cystoscopy all discussed which she understands and  accepts.   PAST MEDICAL HISTORY:  Allergies:  None.   CURRENT MEDICATIONS:  Necon 1/35, Claritin, Advair, Zyrtec, and  Singulair.   REVIEW OF SYSTEMS:  Significant for one cesarean and one vaginal  delivery.  She has a history of asthma for which she uses medicines as  noted.   FAMILY HISTORY:  Significant for father with colon cancer,  diabetes, MI.  Also family history of hypertension in both her mother and father.   PHYSICAL EXAMINATION:  VITAL SIGNS:  Temperature 98.2, blood pressure  120/78.  HEENT: Unremarkable.  NECK:  Supple without masses.  LUNGS:  Clear.  CARDIOVASCULAR:  Regular rate and rhythm without murmurs, rubs, gallops  noted.  BREASTS:  Without masses.  ABDOMEN:  Soft, flat, nontender.  PELVIC EXAM:  Normal external genitalia.  Vagina and cervix clear.  Uterus mid position, normal size.  Adnexa negative.   IMPRESSION:  1. Requests permanent sterilization.  2. Stress urinary incontinence.   PLAN:  Filshie tubal with TOT. Procedure and risks reviewed as above.      Richard M. Marcelle Overlie, M.D.  Electronically Signed     RM[facility]/MEDQ  D:  04/01/2007  T:  04/01/2007  Job:  [id number]

## 2010-08-22 NOTE — Op Note (Signed)
NAMESKYELYNN, RAMBEAU                  ACCOUNT NO.:  [id number]   MEDICAL RECORD NO.:  [id number]          PATIENT TYPE:  AMB   LOCATION:  SDC                           FACILITY:  [facility]   PHYSICIAN:  Duke Salvia. Marcelle Overlie, M.D.DATE OF BIRTH:  11-01-1960   DATE OF PROCEDURE:  04/17/2007  DATE OF DISCHARGE:                               OPERATIVE REPORT   PREOPERATIVE DIAGNOSES:  1. Requests permanent sterilization.  2. Stress urinary incontinence.   PROCEDURE:  1. Laparoscopic tubal ligation by Filshie clip application.  2. Transobturator tape with cystoscopy.   SURGEON:  Duke Salvia. Marcelle Overlie, M.D.   ANESTHESIA:  General.   COMPLICATIONS:  None.   DRAINS:  Foley catheter.   BLOOD LOSS:  Minimal.   SPECIMENS:  None.   PROCEDURE AND FINDINGS:  The patient taken to the operating room.  After  adequate level of general anesthesia was obtained with the patient's  legs in stirrups, the abdomen, perineum and vagina prepped and usual  manner for laparoscopy.  The bladder was drained, EUA carried out.  Uterus midposition, normal size, adnexa negative.  A Hulka tenaculum was  positioned.  Attention directed to the abdomen where the subumbilical  area was infiltrated with 0.5% Marcaine plain.  A small incision was  made, Veress needle introduced without difficulty.  Its intra-abdominal  position verified by pressure and water testing.  After 2.5 liter  pneumoperitoneum was then created, laparoscopic trocar and sleeve were  then introduced without difficulty.  There was no evidence of any  bleeding or trauma.  The pelvic findings were normal.  0.5% Marcaine 4  to 5 mL was then dripped across the tube from the cornu to the  fimbriated end.  Filshie clip applicator was back loaded.  The right  tube was positively identified, traced to the fimbriated end, was  regrasped 2 cm from the cornu with excellent application of the clip in  a perpendicular fashion.  The exact same repeated on the left side  after  carefully identifying the tube.  The application was photo documented.  Instruments were removed, gas allowed to escape, the defect closed with  4-0 Dexon subcuticular suture.  The legs were extended in preparation  for the TOT.  Weighted speculum was positioned.  The position of the  needle passes were marked at the margin of the adductor longus and  inferior pubic ramus at approximately the level of the clitoris.  These  areas were marked after careful palpation.  1% Xylocaine with epi was  then used to infiltrate the mid urethral area and a vertical incision 2  cm was made.  With minimal sharp and some blunt dissection, the area  behind the inferior pubic ramus was dissected free.  The bladder was  redrained at that point.  The first halo needle was then passed and then  the opposite side making sure there was no buttonholing of the vaginal  mucosa.  With the needles in place, cystoscopy was carried out with 70  degrees scope.  There was no evidence of any trauma to the bladder .  The scope was removed.  The bladder was deflated and the mesh was pulled  into  position.  Appropriate tensioning for relaxed fit under the mid urethra  was obtained and the mesh was trimmed.  The mucosal incision was then  closed with 2-0 Vicryl interrupted sutures.  Foley catheter was  positioned draining clear urine, will be removed in PACU.  She tolerated  this well, went to recovery room in good condition.      Richard M. Marcelle Overlie, M.D.  Electronically Signed     RMH/MEDQ  D:  04/17/2007  T:  04/17/2007  Job:  [id number]

## 2010-08-25 NOTE — Assessment & Plan Note (Signed)
[facility]                               PULMONARY OFFICE NOTE   NAME:[name], [name] ERNSTER                         MRN:          [id number]  DATE:02/15/2006                            DOB:          1961/03/18    PULMONARY/ALLERGY FOLLOWUP   PROBLEMS:  1. Chronic sinusitis.  2. Asthmatic bronchitis.  3. Allergic rhinitis.  4. Allergic conjunctivitis.  5. Esophageal reflux.   HISTORY:  She comes for scheduled followup having resumed Claritin D this  fall.  She uses it p.r.n. on top of maintenance Zyrtec and also likes  occasional Astelin.  We discussed this kind of antihistamine use for itching  of eyes, watery rhinorrhea.  She has been on allergy vaccine in the past.  She has used her metered albuterol or her nebulizer p.r.n. very little at  season changes.  There has been nothing purulent.  No pain.  She did have  flu vaccine.   MEDICATIONS:  1. Singulair 10 mg.  2. Zyrtec 10 mg.  3. Astelin for p.r.n.  4. Advair 250/50.  5. Nebulized Albuterol p.r.n.  6. Histinex syrup p.r.n. for cough.  7. Occasional Claritin D.   DRUG INTOLERANCES:  AVALOX, which caused erythema nodosum.   OBJECTIVE:  Weight 150 pounds, BP 122/68, pulse regular 77, room air  saturation 96%.  Conjunctivae are not injected.  Nasal airway seems clear.  Pharynx is clear.  HEART:  Sounds are regular without murmur or gallop.  LUNGS:  Clear to P and A.   IMPRESSION:  Rhinitis and asthma currently under good control.  Heavy use of  antihistamines at times and we discussed this.  She is going to explore use  of over-the-counter eye drops p.r.n. and avoid over-drying.  We refilled her  albuterol rescue inhaler.  Schedule return in 1 year, earlier p.r.n.     Clinton D. Maple Hudson, MD, Tonny Bollman, FACP  Electronically Signed   CDY/MedQ  DD: 02/17/2006  DT: 02/17/2006  Job #: [id number]

## 2010-08-25 NOTE — Discharge Summary (Signed)
[facility]  Patient:    [name] [name], [name] [name] Visit Number: [id number] MRN: [id number]          Service Type: OBS Location: 910A 9108 01 Attending Physician:  Marcelle Overlie Dictated by:   Julio Sicks, N.P. Admit Date:  09/14/2001 Discharge Date: 09/19/2001                             Discharge Summary  ADMITTING DIAGNOSES:              1. Intrauterine pregnancy at term.                                   2. Early labor.                                   3. Spontaneous rupture of membranes.  DISCHARGE DIAGNOSES:              1. Status post low transverse cesarean                                      delivery for arrest of labor and                                      nonreassuring heart tones.                                   2. Viable female infant.  PROCEDURE:                        Primary low transverse cesarean delivery.  REASON FOR ADMISSION:             Please see written H&P.  HOSPITAL COURSE:                  The patient was admitted to [facility] in early labor at 40-6/[redacted] weeks gestation.  The patient had been scheduled for postdate induction later same week.  Fetal heart tones were reassuring.  Cervix was dilated 2-3 cm.  Shortly after admission, epidural was placed and the patient had spontaneous rupture of membranes with clear fluid.  Group B beta strep status was negative.  Labor was progressing. Fetal heart tones were noted to have good recovery with accelerations noted. Long-term variability was good.  Intrauterine pressure catheter was placed. Amnioinfusion was administered per protocol.  Variable decelerations continued.  Cervical dilatation progressed to 8 cm.  Caput was noted at the -2 station.  No cervical change was noted x2 hours and decision was made to proceed with a low transverse cesarean delivery.  The patient was taken to the operating room where epidural anesthesia was augmented to a surgical level. A  low transverse incision was made with the delivery of a viable female infant weighing 10 pounds 3 ounces with Apgars of 8 at one minute and 9 at five minutes.  The patient tolerated the procedure well and was taken to the recovery room in stable condition.  On postoperative day #1, the patient had good return of bowel function.  Foley was removed and the patient was voiding without difficulty.  The patient was ambulating without difficulty.  Labs revealed a hemoglobin of 8.4, hematocrit of 25.5 and WBC count of 9.2.  On postoperative day #2, abdomen was soft and the patient was tolerating a regular diet without complaints of nausea and vomiting.  Incision was clean, dry, and intact.  On postoperative day #3, the patient was doing well. Abdomen was soft, incision was clean, dry, and intact and staples were removed.  The baby was transferred to the NICU, due to cyanotic episodes.  On postoperative day #4, the patient was doing well and was discharged home. Baby remained in the NICU for observation.  CONDITION ON DISCHARGE:           Good.  DIET:                             Regular as tolerated.  ACTIVITY:                         No heavy lifting.  No vaginal intercourse x6 weeks.  No driving x2 weeks.  FOLLOWUP:                         The patient is to follow up in the office in 1-2 weeks for an incision check.  She is to call for temperature greater than 100 degrees, persistent nausea and vomiting, heavy vaginal bleeding, and/or redness or drainage from the incision site.  DISCHARGE MEDICATIONS:            1. Ibuprofen 600 mg every 6 hours p.r.n.                                   2. Percocet (#30) one to two every 4-6 hours                                      p.r.n. pain.                                   3. Niferex 150 mg one p.o. daily. Dictated by:   Julio Sicks, N.P. Attending Physician:  Marcelle Overlie DD:  10/14/01 TD:  10/16/01 Job:  [id number]

## 2010-08-25 NOTE — Op Note (Signed)
[facility]  Patient:    [name] [name], [name] [name] Visit Number: [id number] MRN: [id number]          Service Type: OBS Location: 910A 9108 01 Attending Physician:  Marcelle Overlie Dictated by:   Guy Sandifer. Arleta Creek, M.D. Proc. Date: 09/15/01 Admit Date:  09/14/2001                             Operative Report  PREOPERATIVE DIAGNOSES:       1. Intrauterine pregnancy at 40-6/7 weeks.                               2. Arrest of cervical dilation.                               3. Nonreassuring fetal heart tracing.  POSTOPERATIVE DIAGNOSES:      1. Intrauterine pregnancy at 40-6/7 weeks.                               2. Arrest of cervical dilation.                               3. Nonreassuring fetal heart tracing.  PROCEDURE:                    Low transverse cesarean section.  SURGEON:                      Guy Sandifer. Arleta Creek, M.D.  ANESTHESIA:                   Epidural  ANESTHESIOLOGIST:             Ellison Hughs., M.D.  ESTIMATED BLOOD LOSS:         900 cc.  FINDINGS:                     A viable female infant; Apgars of 8 and 9 at one and five minute respectively.  Birth weight 10 pounds 3 ounces.  Arterial cord pH 7.29.  INDICATIONS AND CONSENT:      This patient is a 50 year old married white female, G2, P72, with an EDC of September 09, 2001.  She presented to the hospital in early labor.  She underwent spontaneous rupture of membranes for clear fluid at approximately 3:40 a.m.  She progressed in labor to 8 cm dilation.  At approximately 7:50 a.m. the cervix was 8 cm dilated, completely effaced, -2 station with caput formation noted.  This was unchanged over a period of approximately two hours.  In addition, the fetal heart tracing was exhibiting deep variable-type decelerations on each contraction, down to the 50s to 80s. At 5:45 a.m. an intrauterine pressure catheter had placed, and an amnioinfusion had been started.  The contractions were  tracing low montevideo units.  However, it was felt that the intrauterine pressure catheter may not functioning fully to record these.  They palpated strong.  However, in view of the high station, and the arrest of dilation at 8 cm and the fetal heart tracing which was in the 110s between contractions; it was not felt that there was time to  replace the IUPC and safely administer Pitocin. Recommendation for cesarean section was made.  Potential risks and complications were discussed with the patient and her husband, including, but not limited to: infection; bowel, bladder or ureteral damage; bleeding requiring transfusion of blood products, and the possible transfusion reaction (HIV, hepatitis acquisition); DVT; PE and pneumonia.  All questions were answered and consent is signed on the chart.  PROCEDURE:                    The patient is taken to the operating room, where an epidural anesthetic is augmented to a surgical level.  She is placed in the dorsal supine position, with a 15-degree left lateral wedge.  Foley catheter is already in place.  The patient is prepped and draped in sterile fashion.  After testing for adequate anesthesia, the skin was entered through a Pfannenstiel incision and dissection is carried out in layers to the peritoneum.  The peritoneum is incised and extended superiorly and inferiorly. The vesicouterine peritoneum is taken down cephalolaterally, and the bladder flap is developed and bladder blade placed.  The uterus is incised in a low transverse manner.  The uterine cavity is entered bluntly with a Kelly clamp. The uterine incision is extended cephalolaterally with the fingers.  The infant is noted to be in the right occiput transverse position.  Vertex is then delivered, the oronasopharynx are suctioned.  The remainder of the baby is delivered.  Cord is clamped and cut, and the infant is handed to the awaiting pediatrics team.  Good cry and tone is noted.   The placenta is manually delivered and sent to pathology.  Uterine cavity is clean.  There is a small extension at the left angle of the uterine incision.  This is closed in a running locking fashion with 0 Monocryl suture.  A second embrocating suture is placed.  There is some bleeding noted at the left angle.  While using the hand behind the broad ligament to assure that the area is clear of any bowel, figure-of-eight sutures are then placed to obtain complete hemostasis.  Tubes and ovaries are normal.  Copious irrigation is carried out and all return is clear.  The anterior peritoneum is then closed in running fashion with 0 Monocryl suture, which is also used to reapproximate the pyramidalis muscle in the midline.  The anterior rectus fascia is closed in running fashion with 0 PDS suture, and the skin is closed with clips.  All sponge, instrument and needle counts are correct, and the patient is taken to the recovery room in stable condition. Dictated by:   Guy Sandifer Arleta Creek, M.D.  Attending Physician:  Marcelle Overlie DD:  09/15/01 TD:  09/16/01 Job: [id number]

## 2010-10-06 ENCOUNTER — Telehealth: Payer: Self-pay | Admitting: Family Medicine

## 2010-10-06 MED ORDER — CITALOPRAM HYDROBROMIDE 20 MG PO TABS: 20.0000 mg | ORAL_TABLET | Freq: Every day | ORAL | Status: DC

## 2010-10-06 NOTE — Telephone Encounter (Signed)
Pt called req refill of Citalopram 20 mg 90 day supply called in to [facility] on [address] [phone]

## 2010-10-25 ENCOUNTER — Telehealth: Payer: Self-pay | Admitting: *Deleted

## 2010-10-25 NOTE — Telephone Encounter (Signed)
Patient is overdue for colonoscopy to follow up on adenomatous colon polyps found on 2006 procedure and due to family history of colon cancer (father). I have left a message for patient to call back.

## 2010-11-02 NOTE — Telephone Encounter (Signed)
Left message for patient to call back  

## 2010-11-08 NOTE — Telephone Encounter (Signed)
We have not received a return phone call. We will send letter to patient.

## 2010-12-27 ENCOUNTER — Encounter: Payer: Self-pay | Admitting: Pulmonary Disease

## 2010-12-27 LAB — CBC
HCT: 39.1
MCV: 87.4
Platelets: 399
WBC: 7.2

## 2010-12-27 LAB — PREGNANCY, URINE: Preg Test, Ur: NEGATIVE

## 2010-12-28 ENCOUNTER — Ambulatory Visit (INDEPENDENT_AMBULATORY_CARE_PROVIDER_SITE_OTHER): Payer: PRIVATE HEALTH INSURANCE | Admitting: Internal Medicine

## 2010-12-28 ENCOUNTER — Encounter: Payer: Self-pay | Admitting: Internal Medicine

## 2010-12-28 VITALS — BP 112/78 | HR 83 | Ht 67.0 in | Wt 166.4 lb

## 2010-12-28 DIAGNOSIS — J309 Allergic rhinitis, unspecified: Secondary | ICD-10-CM

## 2010-12-28 DIAGNOSIS — J45909 Unspecified asthma, uncomplicated: Secondary | ICD-10-CM

## 2010-12-28 DIAGNOSIS — H1045 Other chronic allergic conjunctivitis: Secondary | ICD-10-CM

## 2010-12-28 DIAGNOSIS — Z23 Encounter for immunization: Secondary | ICD-10-CM

## 2010-12-28 MED ORDER — LORATADINE-PSEUDOEPHEDRINE ER 5-120 MG PO TB12: 1.0000 | ORAL_TABLET | Freq: Two times a day (BID) | ORAL | Status: AC

## 2010-12-28 MED ORDER — CETIRIZINE HCL 10 MG PO TABS: 10.0000 mg | ORAL_TABLET | Freq: Every day | ORAL | Status: DC

## 2010-12-28 MED ORDER — MONTELUKAST SODIUM 10 MG PO TABS: 10.0000 mg | ORAL_TABLET | Freq: Every day | ORAL | Status: DC

## 2010-12-28 MED ORDER — AZELASTINE HCL 0.1 % NA SOLN: 1.0000 | Freq: Two times a day (BID) | NASAL | Status: DC

## 2010-12-28 MED ORDER — ALBUTEROL SULFATE HFA 108 (90 BASE) MCG/ACT IN AERS: 2.0000 | INHALATION_SPRAY | RESPIRATORY_TRACT | Status: DC | PRN

## 2010-12-28 MED ORDER — FLUTICASONE-SALMETEROL 100-50 MCG/DOSE IN AEPB: 1.0000 | INHALATION_SPRAY | Freq: Two times a day (BID) | RESPIRATORY_TRACT | Status: DC

## 2010-12-28 NOTE — Patient Instructions (Addendum)
Continue present meds- please call as needed  Flu vax

## 2010-12-28 NOTE — Progress Notes (Signed)
  Subjective:    Patient ID: [name] [name], female    DOB: July 10, 1960, 50 y.o.   MRN: [id number]  HPI 12/28/10- 50 year old female former smoker followed for allergic rhinoconjunctivitis, asthma, complicated by GERD, degenerative disc disease. Last here December 28, 2009 She reports having a good year since last seen, with no special problems. She is comfortable with her medications. She has no routine nasal congestion cough or wheeze. Review of Systems Constitutional:   No-   weight loss, night sweats, fevers, chills, fatigue, lassitude. HEENT:   No-  headaches, difficulty swallowing, tooth/dental problems, sore throat,       No-  sneezing, itching, ear ache, nasal congestion, post nasal drip,  CV:  No-   chest pain, orthopnea, PND, swelling in lower extremities, anasarca, dizziness, palpitations Resp: No-   shortness of breath with exertion or at rest.              No-   productive cough,  No non-productive cough,  No-  coughing up of blood.              No-   change in color of mucus.  No- wheezing.   Skin: No-   rash or lesions. GI:  No-   heartburn, indigestion, abdominal pain, nausea, vomiting, diarrhea,                 change in bowel habits, loss of appetite GU: No-   dysuria, change in color of urine, no urgency or frequency.  No- flank pain. MS:  No-   joint pain or swelling.  No- decreased range of motion.  No- back pain. Neuro- grossly normal  Psych:  No- change in mood or affect. No depression or anxiety.  No memory loss.      Objective:   Physical Exam General- Alert, Oriented, Affect-appropriate, Distress- none acute, comfortable appearing in Skin- rash-none, lesions- none, excoriation- none Lymphadenopathy- none Head- atraumatic            Eyes- Gross vision intact, PERRLA, conjunctivae clear secretions            Ears- Hearing, canals-normal            Nose- Clear, no-Septal dev, mucus, polyps, erosion, perforation             Throat- Mallampati II , mucosa clear ,  drainage- none, tonsils- atrophic Neck- flexible , trachea midline, no stridor , thyroid nl, carotid no bruit Chest - symmetrical excursion , unlabored           Heart/CV- RRR , no murmur , no gallop  , no rub, nl s1 s2                           - JVD- none , edema- none, stasis changes- none, varices- none           Lung- clear to P&A, wheeze- none, cough- none , dullness-none, rub- none           Chest wall-  Abd- tender-no, distended-no, bowel sounds-present, HSM- no Br/ Gen/ Rectal- Not done, not indicated Extrem- cyanosis- none, clubbing, none, atrophy- none, strength- nl Neuro- grossly intact to observation         Assessment & Plan:

## 2011-01-02 NOTE — Assessment & Plan Note (Signed)
Very mild intermittent asthma now. We will leave her with Advair after discussion, and her rescue inhaler.

## 2011-01-02 NOTE — Assessment & Plan Note (Signed)
We reviewed medications and she wishes to continue all, including when necessary use of Astelin and daily use of Zyrtec as needed.

## 2011-01-02 NOTE — Assessment & Plan Note (Signed)
Adequate control through most seasons with antihistamine alone. May occasionally needed over-the-counter eyedrop.

## 2011-01-12 ENCOUNTER — Other Ambulatory Visit: Payer: Self-pay | Admitting: Family Medicine

## 2011-01-26 ENCOUNTER — Encounter: Payer: Self-pay | Admitting: Internal Medicine

## 2011-02-01 ENCOUNTER — Ambulatory Visit (AMBULATORY_SURGERY_CENTER): Payer: PRIVATE HEALTH INSURANCE | Admitting: *Deleted

## 2011-02-01 VITALS — Ht 66.5 in | Wt 163.0 lb

## 2011-02-01 DIAGNOSIS — Z1211 Encounter for screening for malignant neoplasm of colon: Secondary | ICD-10-CM

## 2011-02-01 MED ORDER — PEG-KCL-NACL-NASULF-NA ASC-C 100 G PO SOLR: ORAL | Status: DC

## 2011-02-05 ENCOUNTER — Encounter: Payer: Self-pay | Admitting: Internal Medicine

## 2011-02-08 ENCOUNTER — Other Ambulatory Visit: Payer: Self-pay | Admitting: Internal Medicine

## 2011-02-15 ENCOUNTER — Encounter: Payer: Self-pay | Admitting: Internal Medicine

## 2011-02-15 ENCOUNTER — Ambulatory Visit (AMBULATORY_SURGERY_CENTER): Payer: PRIVATE HEALTH INSURANCE | Admitting: Internal Medicine

## 2011-02-15 VITALS — BP 134/73 | HR 72 | Temp 98.5°F | Resp 15 | Ht 66.0 in | Wt 163.0 lb

## 2011-02-15 DIAGNOSIS — Z800 Family history of malignant neoplasm of digestive organs: Secondary | ICD-10-CM

## 2011-02-15 DIAGNOSIS — D126 Benign neoplasm of colon, unspecified: Secondary | ICD-10-CM

## 2011-02-15 DIAGNOSIS — Z1211 Encounter for screening for malignant neoplasm of colon: Secondary | ICD-10-CM

## 2011-02-15 MED ORDER — SODIUM CHLORIDE 0.9 % IV SOLN: 500.0000 mL | INTRAVENOUS | Status: DC

## 2011-02-15 NOTE — Patient Instructions (Signed)
Please refer to your blue and neon green sheets for instructions regarding diet and activity for the rest of today.  You may resume your medications as you would normally take them.   You will receive a letter in the mail in about two weeks regarding the results of the biopsies taken today.  Colon Polyps A polyp is extra tissue that grows inside your body. Colon polyps grow in the large intestine. The large intestine, also called the colon, is part of your digestive system. It is a long, hollow tube at the end of your digestive tract where your body makes and stores stool. Most polyps are not dangerous. They are benign. This means they are not cancerous. But over time, some types of polyps can turn into cancer. Polyps that are smaller than a pea are usually not harmful. But larger polyps could someday become or may already be cancerous. To be safe, doctors remove all polyps and test them.  WHO GETS POLYPS? Anyone can get polyps, but certain people are more likely than others. You may have a greater chance of getting polyps if:  You are over 50.   You have had polyps before.   Someone in your family has had polyps.   Someone in your family has had cancer of the large intestine.   Find out if someone in your family has had polyps. You may also be more likely to get polyps if you:   Eat a lot of fatty foods.   Smoke.   Drink alcohol.   Do not exercise.   Eat too much.  SYMPTOMS  Most small polyps do not cause symptoms. People often do not know they have one until their caregiver finds it during a regular checkup or while testing them for something else. Some people do have symptoms like these:  Bleeding from the anus. You might notice blood on your underwear or on toilet paper after you have had a bowel movement.   Constipation or diarrhea that lasts more than a week.   Blood in the stool. Blood can make stool look black or it can show up as red streaks in the stool.  If you have  any of these symptoms, see your caregiver. HOW DOES THE DOCTOR TEST FOR POLYPS? The doctor can use four tests to check for polyps:  Digital rectal exam. The caregiver wears gloves and checks your rectum (the last part of the large intestine) to see if it feels normal. This test would find polyps only in the rectum. Your caregiver may need to do one of the other tests listed below to find polyps higher up in the intestine.   Barium enema. The caregiver puts a liquid called barium into your rectum before taking x-rays of your large intestine. Barium makes your intestine look white in the pictures. Polyps are dark, so they are easy to see.   Sigmoidoscopy. With this test, the caregiver can see inside your large intestine. A thin flexible tube is placed into your rectum. The device is called a sigmoidoscope, which has a light and a tiny video camera in it. The caregiver uses the sigmoidoscope to look at the last third of your large intestine.   Colonoscopy. This test is like sigmoidoscopy, but the caregiver looks at all of the large intestine. It usually requires sedation. This is the most common method for finding and removing polyps.  TREATMENT   The caregiver will remove the polyp during sigmoidoscopy or colonoscopy. The polyp is then tested  for cancer.   If you have had polyps, your caregiver may want you to get tested regularly in the future.  PREVENTION  There is not one sure way to prevent polyps. You might be able to lower your risk of getting them if you:  Eat more fruits and vegetables and less fatty food.   Do not smoke.   Avoid alcohol.   Exercise every day.   Lose weight if you are overweight.   Eating more calcium and folate can also lower your risk of getting polyps. Some foods that are rich in calcium are milk, cheese, and broccoli. Some foods that are rich in folate are chickpeas, kidney beans, and spinach.   Aspirin might help prevent polyps. Studies are under way.    Document Released: 12/21/2003 Document Revised: 12/06/2010 Document Reviewed: 05/28/2007 [facility] Patient Information 2012 [place], [place].

## 2011-02-16 ENCOUNTER — Telehealth: Payer: Self-pay | Admitting: *Deleted

## 2011-02-16 NOTE — Telephone Encounter (Signed)
Message left to call if necessary. 

## 2011-02-20 ENCOUNTER — Encounter: Payer: Self-pay | Admitting: Internal Medicine

## 2011-03-10 ENCOUNTER — Other Ambulatory Visit: Payer: Self-pay | Admitting: Family Medicine

## 2011-04-12 ENCOUNTER — Other Ambulatory Visit: Payer: Self-pay | Admitting: Obstetrics and Gynecology

## 2011-04-12 DIAGNOSIS — Z1231 Encounter for screening mammogram for malignant neoplasm of breast: Secondary | ICD-10-CM

## 2011-05-04 ENCOUNTER — Ambulatory Visit
Admission: RE | Admit: 2011-05-04 | Discharge: 2011-05-04 | Disposition: A | Discharge status: Home / Self Care | Payer: PRIVATE HEALTH INSURANCE | Source: Ambulatory Visit | Attending: Obstetrics and Gynecology | Admitting: Obstetrics and Gynecology

## 2011-05-04 DIAGNOSIS — Z1231 Encounter for screening mammogram for malignant neoplasm of breast: Secondary | ICD-10-CM

## 2011-05-07 ENCOUNTER — Other Ambulatory Visit: Payer: Self-pay | Admitting: *Deleted

## 2011-05-07 MED ORDER — CITALOPRAM HYDROBROMIDE 20 MG PO TABS: 20.0000 mg | ORAL_TABLET | Freq: Every day | ORAL | Status: DC

## 2011-05-09 ENCOUNTER — Other Ambulatory Visit: Payer: Self-pay | Admitting: Internal Medicine

## 2011-11-08 ENCOUNTER — Telehealth: Payer: Self-pay | Admitting: Family Medicine

## 2011-11-08 MED ORDER — TOPIRAMATE 25 MG PO TABS: 25.0000 mg | ORAL_TABLET | Freq: Every day | ORAL | Status: DC

## 2011-11-08 NOTE — Telephone Encounter (Signed)
Patient called stating that she need a refill of her topamax [facility] to [facility] [address] as she is changing pharmacies. Please assist.

## 2011-11-08 NOTE — Addendum Note (Signed)
Addended by: Kern Reap B on: 11/08/2011 01:19 PM   Modules accepted: Orders

## 2011-11-08 NOTE — Telephone Encounter (Signed)
Please assist

## 2011-12-16 ENCOUNTER — Other Ambulatory Visit: Payer: Self-pay | Admitting: Family Medicine

## 2011-12-24 ENCOUNTER — Other Ambulatory Visit: Payer: PRIVATE HEALTH INSURANCE

## 2011-12-25 ENCOUNTER — Other Ambulatory Visit (INDEPENDENT_AMBULATORY_CARE_PROVIDER_SITE_OTHER): Payer: PRIVATE HEALTH INSURANCE

## 2011-12-25 DIAGNOSIS — Z0000 Encounter for general adult medical examination without abnormal findings: Secondary | ICD-10-CM

## 2011-12-25 LAB — LIPID PANEL
HDL: 55.5 mg/dL (ref >39.00)
Total CHOL/HDL Ratio: 4
Triglycerides: 114 mg/dL (ref 0.0–149.0)

## 2011-12-25 LAB — HEPATIC FUNCTION PANEL
AST: 17 U/L (ref 0–37)
Total Bilirubin: 0.6 mg/dL (ref 0.3–1.2)

## 2011-12-25 LAB — POCT URINALYSIS DIPSTICK
Bilirubin, UA: NEGATIVE
Glucose, UA: NEGATIVE
Leukocytes, UA: NEGATIVE
Nitrite, UA: NEGATIVE

## 2011-12-25 LAB — CBC WITH DIFFERENTIAL/PLATELET
Basophils Relative: 0.4 % (ref 0.0–3.0)
Eosinophils Relative: 4.5 % (ref 0.0–5.0)
MCV: 93.3 fl (ref 78.0–100.0)
Monocytes Absolute: 0.3 10*3/uL (ref 0.1–1.0)
Neutrophils Relative %: 65.9 % (ref 43.0–77.0)
RBC: 4.26 Mil/uL (ref 3.87–5.11)
WBC: 6.2 10*3/uL (ref 4.5–10.5)

## 2011-12-25 LAB — BASIC METABOLIC PANEL
Chloride: 101 mEq/L (ref 96–112)
Creatinine, Ser: 0.9 mg/dL (ref 0.4–1.2)
Potassium: 3.7 mEq/L (ref 3.5–5.1)
Sodium: 137 mEq/L (ref 135–145)

## 2011-12-25 LAB — TSH: TSH: 2.8 u[IU]/mL (ref 0.35–5.50)

## 2011-12-25 LAB — LDL CHOLESTEROL, DIRECT: Direct LDL: 129.2 mg/dL

## 2011-12-28 ENCOUNTER — Ambulatory Visit: Payer: PRIVATE HEALTH INSURANCE | Admitting: Internal Medicine

## 2011-12-31 ENCOUNTER — Ambulatory Visit: Payer: PRIVATE HEALTH INSURANCE | Admitting: Internal Medicine

## 2012-01-01 ENCOUNTER — Encounter: Payer: Self-pay | Admitting: Family Medicine

## 2012-01-01 ENCOUNTER — Ambulatory Visit (INDEPENDENT_AMBULATORY_CARE_PROVIDER_SITE_OTHER): Payer: PRIVATE HEALTH INSURANCE | Admitting: Family Medicine

## 2012-01-01 VITALS — BP 110/80 | Temp 98.4°F | Ht 65.0 in | Wt 168.0 lb

## 2012-01-01 DIAGNOSIS — J45909 Unspecified asthma, uncomplicated: Secondary | ICD-10-CM

## 2012-01-01 DIAGNOSIS — N949 Unspecified condition associated with female genital organs and menstrual cycle: Secondary | ICD-10-CM

## 2012-01-01 DIAGNOSIS — K219 Gastro-esophageal reflux disease without esophagitis: Secondary | ICD-10-CM

## 2012-01-01 DIAGNOSIS — N925 Other specified irregular menstruation: Secondary | ICD-10-CM

## 2012-01-01 DIAGNOSIS — H9190 Unspecified hearing loss, unspecified ear: Secondary | ICD-10-CM

## 2012-01-01 DIAGNOSIS — J309 Allergic rhinitis, unspecified: Secondary | ICD-10-CM

## 2012-01-01 DIAGNOSIS — Z23 Encounter for immunization: Secondary | ICD-10-CM

## 2012-01-01 DIAGNOSIS — R51 Headache: Secondary | ICD-10-CM

## 2012-01-01 MED ORDER — LEVONORGEST-ETH ESTRAD 91-DAY 0.15-0.03 MG PO TABS: 1.0000 | ORAL_TABLET | Freq: Every day | ORAL | Status: DC

## 2012-01-01 MED ORDER — AZELASTINE HCL 0.1 % NA SOLN: 1.0000 | Freq: Two times a day (BID) | NASAL | Status: DC

## 2012-01-01 MED ORDER — CITALOPRAM HYDROBROMIDE 20 MG PO TABS: 20.0000 mg | ORAL_TABLET | ORAL | Status: DC

## 2012-01-01 MED ORDER — RIZATRIPTAN BENZOATE 5 MG PO TBDP: 5.0000 mg | ORAL_TABLET | ORAL | Status: DC | PRN

## 2012-01-01 MED ORDER — FLUTICASONE-SALMETEROL 100-50 MCG/DOSE IN AEPB: 1.0000 | INHALATION_SPRAY | Freq: Two times a day (BID) | RESPIRATORY_TRACT | Status: DC

## 2012-01-01 MED ORDER — ALBUTEROL SULFATE HFA 108 (90 BASE) MCG/ACT IN AERS: 2.0000 | INHALATION_SPRAY | RESPIRATORY_TRACT | Status: DC | PRN

## 2012-01-01 MED ORDER — TOPIRAMATE 50 MG PO TABS: 50.0000 mg | ORAL_TABLET | Freq: Two times a day (BID) | ORAL | Status: DC

## 2012-01-01 MED ORDER — MONTELUKAST SODIUM 10 MG PO TABS: 10.0000 mg | ORAL_TABLET | Freq: Every day | ORAL | Status: DC

## 2012-01-01 NOTE — Progress Notes (Signed)
  Subjective:    Patient ID: [name] [name], female    DOB: 21-May-1960, 51 y.o.   MRN: [id number]  HPI Tniya is a 51 year old married female nonsmoker who comes in today for general physical examination because of a history of asthma, allergic rhinitis, mild depression, dysfunction uterine bleeding, migraine headaches, and skin problems  Her medications reviewed and there've been no changes except she is on continuous BCPs because of the dysfunction uterine bleeding. She also went to see a dermatologist who put her on Retin-A and desonide for her skin.  She gets routine eye care, dental care, BSE monthly, and you mammography, tetanus 2003 booster today, seasonal flu shot at work, Pneumovax x2.  She also states that her 61 year old son has had a work to workup because of poor school performance and was found at EGD. This is caused a lot of stress and has increased her frequency and severity of migraine headaches.   Review of Systems  Constitutional: Negative.   HENT: Negative.   Eyes: Negative.   Respiratory: Negative.   Cardiovascular: Negative.   Gastrointestinal: Negative.   Genitourinary: Negative.   Musculoskeletal: Negative.   Neurological: Negative.   Hematological: Negative.   Psychiatric/Behavioral: Negative.        Objective:   Physical Exam  Constitutional: She appears well-developed and well-nourished.  HENT:  Head: Normocephalic and atraumatic.  Right Ear: External ear normal.  Left Ear: External ear normal.  Nose: Nose normal.  Mouth/Throat: Oropharynx is clear and moist.  Eyes: EOM are normal. Pupils are equal, round, and reactive to light.  Neck: Normal range of motion. Neck supple. No thyromegaly present.  Cardiovascular: Normal rate, regular rhythm, normal heart sounds and intact distal pulses.  Exam reveals no gallop and no friction rub.   No murmur heard. Pulmonary/Chest: Effort normal and breath sounds normal.  Abdominal: Soft. Bowel sounds are normal. She  exhibits no distension and no mass. There is no tenderness. There is no rebound.  Genitourinary:       Bilateral breast exam normal except for diffuse fibrocystic changes  Musculoskeletal: Normal range of motion.  Lymphadenopathy:    She has no cervical adenopathy.  Neurological: She is alert. She has normal reflexes. No cranial nerve deficit. She exhibits normal muscle tone. Coordination normal.  Skin: Skin is warm and dry.       Total body skin exam normal  Psychiatric: She has a normal mood and affect. Her behavior is normal. Judgment and thought content normal.          Assessment & Plan:  Healthy female  Chronic asthma continue Advair albuterol when necessary  Allergic rhinitis continue Astelin nasal spray, Singulair, Claritin, Afrin when necessary  Migraine headaches increase Topamax to 50 mg daily  Dysfunction uterine bleeding go to the seasonal  Dermatitis followup by dermatologist  Hearing loss recommend audiology consult

## 2012-01-01 NOTE — Patient Instructions (Signed)
Switched to the seasonal BCPs  Increase the Topamax to 50 mg daily Maxalt when necessary for breakthrough headache followup in 2 months  Continue your other medications  Physical exam yearly

## 2012-01-04 ENCOUNTER — Ambulatory Visit: Payer: PRIVATE HEALTH INSURANCE | Admitting: Internal Medicine

## 2012-01-17 ENCOUNTER — Ambulatory Visit (INDEPENDENT_AMBULATORY_CARE_PROVIDER_SITE_OTHER): Payer: PRIVATE HEALTH INSURANCE | Admitting: Internal Medicine

## 2012-01-17 ENCOUNTER — Encounter: Payer: Self-pay | Admitting: Internal Medicine

## 2012-01-17 VITALS — BP 122/78 | HR 73 | Ht 65.5 in | Wt 169.8 lb

## 2012-01-17 DIAGNOSIS — J309 Allergic rhinitis, unspecified: Secondary | ICD-10-CM

## 2012-01-17 DIAGNOSIS — J45909 Unspecified asthma, uncomplicated: Secondary | ICD-10-CM

## 2012-01-17 NOTE — Progress Notes (Signed)
  Subjective:    Patient ID: [name] [name], female    DOB: May 31, 1960, 51 y.o.   MRN: [id number]  HPI 12/28/10- 51 year old female former smoker followed for allergic rhinoconjunctivitis, asthma, complicated by GERD, degenerative disc disease. Last here December 28, 2009 She reports having a good year since last seen, with no special problems. She is comfortable with her medications. She has no routine nasal congestion cough or wheeze.  01/17/12- 51 year old female former smoker followed for allergic rhinoconjunctivitis, asthma, complicated by GERD, degenerative disc disease She got flu shot at work. Had good spring and fall this year with no special problems. Occasional mild wheeze. Has cats. Routinely taking Claritin and Singulair. Advair 100 is used once daily. Rescue inhaler used very occasionally, mostly if she exercises. She asks no changes. There is occasional nasal congestion.  Review of Systems- see HPI Constitutional:   No-   weight loss, night sweats, fevers, chills, fatigue, lassitude. HEENT:   No-  headaches, difficulty swallowing, tooth/dental problems, sore throat,       No-  sneezing, itching, ear ache, +nasal congestion, post nasal drip,  CV:  No-   chest pain, orthopnea, PND, swelling in lower extremities, anasarca, dizziness, palpitations Resp: No-   shortness of breath with exertion or at rest.              No-   productive cough,  No non-productive cough,  No-  coughing up of blood.              No-   change in color of mucus.  + wheezing.   Skin: No-   rash or lesions. GI:  No-   heartburn, indigestion, abdominal pain, nausea, vomiting, GU:  MS:  No-   joint pain or swelling.   Neuro- grossly normal  Psych:  No- change in mood or affect. No depression or anxiety.  No memory loss.  Objective:   Physical Exam General- Alert, Oriented, Affect-appropriate, Distress- none acute, comfortable appearing  Skin- rash-none, lesions- none, excoriation- none Lymphadenopathy-  none Head- atraumatic            Eyes- Gross vision intact, PERRLA, conjunctivae clear secretions            Ears- Hearing, canals-normal            Nose- + mucus, no-Septal dev,  polyps, erosion, perforation             Throat- Mallampati II , mucosa clear , drainage- none, tonsils- atrophic Neck- flexible , trachea midline, no stridor , thyroid nl, carotid no bruit Chest - symmetrical excursion , unlabored           Heart/CV- RRR , no murmur , no gallop  , no rub, nl s1 s2                           - JVD- none , edema- none, stasis changes- none, varices- none           Lung- clear to P&A, wheeze- none, cough- none , dullness-none, rub- none           Chest wall-  Abd-  Br/ Gen/ Rectal- Not done, not indicated Extrem- cyanosis- none, clubbing, none, atrophy- none, strength- nl Neuro- grossly intact to observation Assessment & Plan:

## 2012-01-23 NOTE — Patient Instructions (Signed)
Sample Dymista nasal spray 

## 2012-01-23 NOTE — Assessment & Plan Note (Signed)
Adequately controlled mild intermittent asthma

## 2012-01-23 NOTE — Assessment & Plan Note (Signed)
She has enough nasal symptoms that were to try a sample of Dymista. nasal spray

## 2012-02-20 ENCOUNTER — Other Ambulatory Visit: Payer: Self-pay | Admitting: Family Medicine

## 2012-03-03 ENCOUNTER — Ambulatory Visit (INDEPENDENT_AMBULATORY_CARE_PROVIDER_SITE_OTHER): Payer: PRIVATE HEALTH INSURANCE | Admitting: Family Medicine

## 2012-03-03 ENCOUNTER — Encounter: Payer: Self-pay | Admitting: Family Medicine

## 2012-03-03 VITALS — BP 120/80 | Temp 98.0°F | Wt 169.0 lb

## 2012-03-03 DIAGNOSIS — R51 Headache: Secondary | ICD-10-CM

## 2012-03-03 DIAGNOSIS — J45909 Unspecified asthma, uncomplicated: Secondary | ICD-10-CM

## 2012-03-03 MED ORDER — PREDNISONE 10 MG PO TABS: ORAL_TABLET | ORAL | Status: DC

## 2012-03-03 NOTE — Patient Instructions (Signed)
Prednisone 10 mg,,,,,,,,, 1 tab daily for 2 weeks then 1 tab Monday Wednesday Friday for a 3 week taper  Decrease the Topamax to 25 mg daily  Return in 2 months for followup sooner if any problems

## 2012-03-03 NOTE — Progress Notes (Signed)
  Subjective:    Patient ID: [name] [name], female    DOB: 1960-11-30, 51 y.o.   MRN: [id number]  HPI [name] [name] is a 51 year old married female nonsmoker who comes in today for followup of 2 problems  She has a history of underlying asthma and is on inhaled steroids one puff twice daily, Singulair, a nasal spray by Dr. Maple Hudson, Zyrtec 10 mg daily. She's not had the need her albuterol recently  She continues to have daily migraine headaches. She takes over-the-counter medication when necessary and we increased her Topamax to 50 mg in September however she says it makes her sleepy and the drowsiness has not worn off.    Review of Systems     general pulmonary neurologic review of systems otherwise negative. Offered a consult with the headache clinic however she says she's been there before and it did not seem to help much.  Objective:   Physical Exam   well-developed well-nourished female no acute distress lung exam normal no wheezing       Assessment & Plan:   asthma allergic rhinitis under good control continue current medications  Migraine headaches evolved into chronic daily headaches plan prednisone burst and taper decrease the Topamax to 50 mg daily because of persistent side effects

## 2012-04-10 ENCOUNTER — Other Ambulatory Visit: Payer: Self-pay | Admitting: Obstetrics and Gynecology

## 2012-04-10 DIAGNOSIS — Z1231 Encounter for screening mammogram for malignant neoplasm of breast: Secondary | ICD-10-CM

## 2012-05-05 ENCOUNTER — Encounter: Payer: Self-pay | Admitting: Family Medicine

## 2012-05-05 ENCOUNTER — Ambulatory Visit (INDEPENDENT_AMBULATORY_CARE_PROVIDER_SITE_OTHER): Payer: PRIVATE HEALTH INSURANCE | Admitting: Family Medicine

## 2012-05-05 VITALS — BP 130/80 | Temp 98.7°F | Wt 169.0 lb

## 2012-05-05 DIAGNOSIS — R51 Headache: Secondary | ICD-10-CM

## 2012-05-05 NOTE — Progress Notes (Signed)
  Subjective:    Patient ID: [name] [name], female    DOB: 10/09/1960, 52 y.o.   MRN: [id number]  HPI [name] [name] is a 52 year old female married nonsmoker who comes in today for followup of 2 problems  She had a flare of her migraines which evolved into chronic daily migraines and we put on a short course of prednisone. She tapered and the chronic daily headache stopped. We also started on Topamax 50 mg daily which she cannot tolerate it made her too sleepy therefore she cut the dose to 25 mg daily. 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 Vonna [name] when necessary  Migraine headaches continue Topamax 25 mg daily Maxalt when necessary

## 2012-05-05 NOTE — Patient Instructions (Signed)
Continue the Topamax 25 mg daily  If the over-the-counter medication does not help the migraines then take the Maxalt.  Return when necessary  Continue to avoid wearing the contact lens. If the irritation does not resolve then call Dr. Gweneth Dimitri ophthalmologist

## 2012-05-06 ENCOUNTER — Ambulatory Visit: Payer: PRIVATE HEALTH INSURANCE

## 2012-05-14 ENCOUNTER — Other Ambulatory Visit: Payer: Self-pay | Admitting: Internal Medicine

## 2012-05-14 DIAGNOSIS — J309 Allergic rhinitis, unspecified: Secondary | ICD-10-CM

## 2012-05-14 DIAGNOSIS — J45909 Unspecified asthma, uncomplicated: Secondary | ICD-10-CM

## 2012-05-14 MED ORDER — MONTELUKAST SODIUM 10 MG PO TABS: 10.0000 mg | ORAL_TABLET | Freq: Every day | ORAL | Status: DC

## 2012-05-20 ENCOUNTER — Other Ambulatory Visit: Payer: Self-pay | Admitting: Family Medicine

## 2012-05-21 ENCOUNTER — Ambulatory Visit
Admission: RE | Admit: 2012-05-21 | Discharge: 2012-05-21 | Disposition: A | Discharge status: Home / Self Care | Payer: PRIVATE HEALTH INSURANCE | Source: Ambulatory Visit | Attending: Obstetrics and Gynecology | Admitting: Obstetrics and Gynecology

## 2012-05-21 DIAGNOSIS — Z1231 Encounter for screening mammogram for malignant neoplasm of breast: Secondary | ICD-10-CM

## 2012-08-08 ENCOUNTER — Other Ambulatory Visit: Payer: Self-pay | Admitting: Family Medicine

## 2012-11-04 ENCOUNTER — Other Ambulatory Visit: Payer: Self-pay | Admitting: Internal Medicine

## 2012-11-05 NOTE — Telephone Encounter (Signed)
Ok to fill script Claritin D 12 hr, # 60 , 1 bid prn, ref x 5

## 2012-11-05 NOTE — Telephone Encounter (Signed)
Pt advise if okay to refill; not on medication list and last seen 01-2012. Thanks.

## 2012-12-10 ENCOUNTER — Other Ambulatory Visit: Payer: Self-pay | Admitting: Family Medicine

## 2012-12-25 ENCOUNTER — Other Ambulatory Visit (INDEPENDENT_AMBULATORY_CARE_PROVIDER_SITE_OTHER): Payer: PRIVATE HEALTH INSURANCE

## 2012-12-25 DIAGNOSIS — Z0000 Encounter for general adult medical examination without abnormal findings: Secondary | ICD-10-CM

## 2012-12-25 LAB — POCT URINALYSIS DIPSTICK
Bilirubin, UA: NEGATIVE
Glucose, UA: NEGATIVE
Nitrite, UA: NEGATIVE
Spec Grav, UA: 1.025
Urobilinogen, UA: 0.2

## 2012-12-25 LAB — CBC WITH DIFFERENTIAL/PLATELET
Basophils Absolute: 0 10*3/uL (ref 0.0–0.1)
Eosinophils Absolute: 0.4 10*3/uL (ref 0.0–0.7)
Lymphocytes Relative: 24.2 % (ref 12.0–46.0)
MCHC: 33.4 g/dL (ref 30.0–36.0)
Monocytes Relative: 6.1 % (ref 3.0–12.0)
Neutro Abs: 3.7 10*3/uL (ref 1.4–7.7)
Platelets: 303 10*3/uL (ref 150.0–400.0)
RDW: 12.6 % (ref 11.5–14.6)

## 2012-12-25 LAB — BASIC METABOLIC PANEL
BUN: 17 mg/dL (ref 6–23)
CO2: 28 mEq/L (ref 19–32)
Calcium: 9.1 mg/dL (ref 8.4–10.5)
Creatinine, Ser: 1 mg/dL (ref 0.4–1.2)
GFR: 63.66 mL/min (ref >60.00)
Glucose, Bld: 86 mg/dL (ref 70–99)

## 2012-12-25 LAB — LIPID PANEL
Cholesterol: 238 mg/dL — ABNORMAL HIGH (ref 0–200)
HDL: 59.5 mg/dL (ref >39.00)
Triglycerides: 75 mg/dL (ref 0.0–149.0)

## 2012-12-25 LAB — HEPATIC FUNCTION PANEL
AST: 15 U/L (ref 0–37)
Albumin: 4 g/dL (ref 3.5–5.2)
Alkaline Phosphatase: 51 U/L (ref 39–117)
Total Bilirubin: 0.4 mg/dL (ref 0.3–1.2)

## 2012-12-25 LAB — TSH: TSH: 2.54 u[IU]/mL (ref 0.35–5.50)

## 2013-01-01 ENCOUNTER — Ambulatory Visit (INDEPENDENT_AMBULATORY_CARE_PROVIDER_SITE_OTHER): Payer: PRIVATE HEALTH INSURANCE | Admitting: Family

## 2013-01-01 ENCOUNTER — Encounter: Payer: PRIVATE HEALTH INSURANCE | Admitting: Family Medicine

## 2013-01-01 ENCOUNTER — Encounter: Payer: Self-pay | Admitting: Family

## 2013-01-01 VITALS — BP 126/80 | HR 73 | Ht 65.0 in | Wt 166.0 lb

## 2013-01-01 DIAGNOSIS — E7800 Pure hypercholesterolemia, unspecified: Secondary | ICD-10-CM

## 2013-01-01 DIAGNOSIS — Z0000 Encounter for general adult medical examination without abnormal findings: Secondary | ICD-10-CM

## 2013-01-01 DIAGNOSIS — J3489 Other specified disorders of nose and nasal sinuses: Secondary | ICD-10-CM

## 2013-01-01 NOTE — Progress Notes (Signed)
Subjective:    Patient ID: [name] [name], female    DOB: Jan 17, 1961, 52 y.o.   MRN: [id number]  HPI A 52 year old white female, nonsmoker and for is a routine physical examination for this healthy  Female. Reviewed all health maintenance protocols including mammography colonoscopy bone density and reviewed appropriate screening labs. Her immunization history was reviewed as well as her current medications and allergies refills of her chronic medications were given and the plan for yearly health maintenance was discussed all orders and referrals were made as appropriate.  Also has concerns of a sore inside of her left knee her that is tender and not healing well. Reports increased stress and inability to exercise taking care of her mother and children.   Review of Systems  Constitutional: Negative.   HENT: Negative.   Eyes: Negative.   Respiratory: Negative.   Cardiovascular: Negative.   Gastrointestinal: Negative.   Endocrine: Negative.   Genitourinary: Negative.   Musculoskeletal: Negative.   Skin: Negative.   Allergic/Immunologic: Negative.   Neurological: Negative.   Hematological: Negative.   Psychiatric/Behavioral: Negative.    Past Medical History  Diagnosis Date  . Asthma   . Colon polyps   . GERD (gastroesophageal reflux disease)   . Allergic rhinitis   . Fibrocystic breast changes   . Cervical disc disease   . Anemia     borderline    History   Social History  . Marital Status: Married    Spouse Name: N/A    Number of Children: N/A  . Years of Education: N/A   Occupational History  . laid off     radiology tech   Social History Main Topics  . Smoking status: Former Smoker    Types: Cigarettes  . Smokeless tobacco: Not on file  . Alcohol Use: 1.1 oz/week    1 Glasses of wine, 1 Drinks containing 0.5 oz of alcohol per week  . Drug Use: No  . Sexual Activity: Not on file   Other Topics Concern  . Not on file   Social History Narrative  . No  narrative on file    Past Surgical History  Procedure Laterality Date  . Mandible surgery    . Cesarean section  2003  . Bladder suspension  2008  . Tubal ligation  2008    Family History  Problem Relation Age of Onset  . Asthma    . Allergies    . Colon cancer Father     Allergies  Allergen Reactions  . Codeine     GI Upset  . Moxifloxacin Hives    Current Outpatient Prescriptions on File Prior to Visit  Medication Sig Dispense Refill  . albuterol (PROVENTIL HFA;VENTOLIN HFA) 108 (90 BASE) MCG/ACT inhaler Inhale 2 puffs into the lungs every 4 (four) hours as needed.  18 g  2  . azelastine (ASTELIN) 137 MCG/SPRAY nasal spray Place 1 spray into the nose 2 (two) times daily. Use in each nostril as directed  30 mL  11  . Azelastine-Fluticasone 137-50 MCG/ACT SUSP Place into the nose.      . cetirizine (ZYRTEC) 10 MG tablet Take 10 mg by mouth daily.      . citalopram (CELEXA) 20 MG tablet TAKE 1 TABLET EVERY DAY  90 tablet  1  . CLARITIN-D 12 HOUR 5-120 MG per tablet TAKE 1 TABLET BY MOUTH TWICE A DAY  60 tablet  5  . desonide (DESOWEN) 0.05 % cream Apply topically 2 (two) times daily.      Marland Kitchen  Fluticasone-Salmeterol (ADVAIR DISKUS) 100-50 MCG/DOSE AEPB Inhale 1 puff into the lungs 2 (two) times daily.  180 each  3  . montelukast (SINGULAIR) 10 MG tablet Take 1 tablet (10 mg total) by mouth at bedtime.  90 tablet  2  . oxymetazoline (AFRIN NASAL SPRAY) 0.05 % nasal spray Place 2 sprays into the nose 2 (two) times daily.        . rizatriptan (MAXALT-MLT) 5 MG disintegrating tablet Take 1 tablet (5 mg total) by mouth as needed for migraine. May repeat in 2 hours if needed  10 tablet  10  . topiramate (TOPAMAX) 25 MG tablet Take 25 mg by mouth daily.       Marland Kitchen topiramate (TOPAMAX) 50 MG tablet TAKE 1 TABLET (50 MG TOTAL) BY MOUTH 2 (TWO) TIMES DAILY.  100 tablet  2  . levonorgestrel-ethinyl estradiol (SEASONALE) 0.15-0.03 MG tablet Take 1 tablet by mouth daily.  1 Package  4  .  tretinoin (RETIN-A) 0.025 % cream Apply topically at bedtime.       No current facility-administered medications on file prior to visit.    BP 126/80  Pulse 73  Ht 5\' 5"  (1.651 m)  Wt 166 lb (75.297 kg)  BMI 27.62 kg/m2chart    Objective:   Physical Exam  Constitutional: She is oriented to person, place, and time. She appears well-developed and well-nourished.  HENT:  Head: Normocephalic.  Right Ear: External ear normal.  Nose: Nose normal.  Mouth/Throat: Oropharynx is clear and moist.  Eyes: Conjunctivae and EOM are normal. Pupils are equal, round, and reactive to light.  Neck: Normal range of motion. Neck supple. No thyromegaly present.  Cardiovascular: Normal rate, regular rhythm and normal heart sounds.   Pulmonary/Chest: Effort normal and breath sounds normal.  Abdominal: Soft. Bowel sounds are normal. She exhibits no distension. There is no tenderness.  Musculoskeletal: Normal range of motion.  Neurological: She is alert and oriented to person, place, and time. She has normal reflexes.  Skin: Skin is warm and dry.  Psychiatric: She has a normal mood and affect.          Assessment & Plan:  Assessment: 1. Complete physical exam 2. Likely cold sore-left nare 3 GERD 4. Hypercholesterolemia  Plan: Will culture sent. Labs discussed. Refer for mammogram screening. Encouraged exercise daily. I have advised patient that her LDL is worse this year than before and may require medications if she cannot reduce it with lifestyle modification. Therefore, we have agreed to reck her back in 3 months to be sure she's reduced her we and began to eat better. Recheck cholesterol at that time.

## 2013-01-01 NOTE — Patient Instructions (Signed)

## 2013-01-04 LAB — WOUND CULTURE
Gram Stain: NONE SEEN
Gram Stain: NONE SEEN

## 2013-01-10 ENCOUNTER — Other Ambulatory Visit: Payer: Self-pay | Admitting: Family Medicine

## 2013-02-08 ENCOUNTER — Other Ambulatory Visit: Payer: Self-pay | Admitting: Family Medicine

## 2013-02-08 ENCOUNTER — Other Ambulatory Visit: Payer: Self-pay | Admitting: Internal Medicine

## 2013-02-11 ENCOUNTER — Other Ambulatory Visit: Payer: Self-pay | Admitting: Family Medicine

## 2013-02-12 ENCOUNTER — Other Ambulatory Visit: Payer: Self-pay

## 2013-03-26 ENCOUNTER — Ambulatory Visit: Payer: PRIVATE HEALTH INSURANCE | Admitting: Family Medicine

## 2013-03-30 ENCOUNTER — Ambulatory Visit: Payer: PRIVATE HEALTH INSURANCE | Admitting: Family Medicine

## 2013-04-14 ENCOUNTER — Encounter: Payer: Self-pay | Admitting: Family Medicine

## 2013-04-14 ENCOUNTER — Ambulatory Visit (INDEPENDENT_AMBULATORY_CARE_PROVIDER_SITE_OTHER): Payer: PRIVATE HEALTH INSURANCE | Admitting: Family Medicine

## 2013-04-14 ENCOUNTER — Other Ambulatory Visit: Payer: Self-pay | Admitting: Family Medicine

## 2013-04-14 VITALS — BP 120/80 | Temp 98.5°F | Wt 164.0 lb

## 2013-04-14 DIAGNOSIS — M19012 Primary osteoarthritis, left shoulder: Secondary | ICD-10-CM

## 2013-04-14 DIAGNOSIS — Z862 Personal history of diseases of the blood and blood-forming organs and certain disorders involving the immune mechanism: Secondary | ICD-10-CM

## 2013-04-14 DIAGNOSIS — Z8639 Personal history of other endocrine, nutritional and metabolic disease: Secondary | ICD-10-CM | POA: Insufficient documentation

## 2013-04-14 DIAGNOSIS — M19019 Primary osteoarthritis, unspecified shoulder: Secondary | ICD-10-CM

## 2013-04-14 LAB — LIPID PANEL
CHOL/HDL RATIO: 3
CHOLESTEROL: 247 mg/dL — AB (ref 0–200)
HDL: 70.8 mg/dL (ref >39.00)
TRIGLYCERIDES: 79 mg/dL (ref 0.0–149.0)
VLDL: 15.8 mg/dL (ref 0.0–40.0)

## 2013-04-14 LAB — LDL CHOLESTEROL, DIRECT: LDL DIRECT: 157.7 mg/dL

## 2013-04-14 NOTE — Progress Notes (Signed)
Pre visit review using our clinic review tool, if applicable. No additional management support is needed unless otherwise documented below in the visit note. 

## 2013-04-14 NOTE — Progress Notes (Signed)
   Subjective:    Patient ID: [name] [name], female    DOB: 23-Jun-1960, 53 y.o.   MRN: [id number]  HPI [name] [name] is a 53 year old married female nonsmoker who comes in today for followup  She was seen here in the fall by [facility] and had some mild elevation of her lipids. She saw her diet she's lost about 8 pounds and she is back for followup lipid analysis.  She's also noticed a soreness in her hips hands shoulders and knees no redness swelling.   Review of Systems    review of systems otherwise negative...Marland KitchenMarland KitchenMarland Kitchen mother has had multiple fractures father has dementia. She and her brother taking care of the parents at home Objective:   Physical Exam  Well-developed well-nourished female no acute distress vital signs stable she is afebrile joint exam normal      Assessment & Plan:  Osteoarthritis Motrin 400 twice a day  Mild elevation lipids recheck labs

## 2013-04-14 NOTE — Patient Instructions (Signed)
I will call you about your lab work  Motrin 400 mg twice daily with food for the soreness in her joints

## 2013-04-17 ENCOUNTER — Other Ambulatory Visit: Payer: Self-pay

## 2013-04-17 DIAGNOSIS — Z1231 Encounter for screening mammogram for malignant neoplasm of breast: Secondary | ICD-10-CM

## 2013-05-26 ENCOUNTER — Ambulatory Visit: Payer: PRIVATE HEALTH INSURANCE

## 2013-06-02 ENCOUNTER — Ambulatory Visit: Payer: PRIVATE HEALTH INSURANCE

## 2013-06-12 ENCOUNTER — Ambulatory Visit
Admission: RE | Admit: 2013-06-12 | Discharge: 2013-06-12 | Disposition: A | Discharge status: Home / Self Care | Payer: Self-pay | Source: Ambulatory Visit

## 2013-06-12 DIAGNOSIS — Z1231 Encounter for screening mammogram for malignant neoplasm of breast: Secondary | ICD-10-CM

## 2013-08-05 ENCOUNTER — Other Ambulatory Visit: Payer: Self-pay | Admitting: Family Medicine

## 2013-11-07 ENCOUNTER — Other Ambulatory Visit: Payer: Self-pay | Admitting: Family Medicine

## 2013-11-16 ENCOUNTER — Other Ambulatory Visit: Payer: Self-pay | Admitting: Family Medicine

## 2013-12-28 ENCOUNTER — Other Ambulatory Visit (INDEPENDENT_AMBULATORY_CARE_PROVIDER_SITE_OTHER): Payer: PRIVATE HEALTH INSURANCE

## 2013-12-28 DIAGNOSIS — Z0000 Encounter for general adult medical examination without abnormal findings: Secondary | ICD-10-CM

## 2013-12-28 LAB — LIPID PANEL
CHOLESTEROL: 213 mg/dL — AB (ref 0–200)
HDL: 63.3 mg/dL (ref >39.00)
LDL Cholesterol: 137 mg/dL — ABNORMAL HIGH (ref 0–99)
NonHDL: 149.7
TRIGLYCERIDES: 65 mg/dL (ref 0.0–149.0)
Total CHOL/HDL Ratio: 3
VLDL: 13 mg/dL (ref 0.0–40.0)

## 2013-12-28 LAB — BASIC METABOLIC PANEL
BUN: 15 mg/dL (ref 6–23)
CALCIUM: 9.2 mg/dL (ref 8.4–10.5)
CO2: 30 meq/L (ref 19–32)
CREATININE: 0.8 mg/dL (ref 0.4–1.2)
Chloride: 107 mEq/L (ref 96–112)
GFR: 85.02 mL/min (ref >60.00)
Glucose, Bld: 86 mg/dL (ref 70–99)
Potassium: 3.6 mEq/L (ref 3.5–5.1)
SODIUM: 141 meq/L (ref 135–145)

## 2013-12-28 LAB — CBC WITH DIFFERENTIAL/PLATELET
Basophils Absolute: 0 10*3/uL (ref 0.0–0.1)
Basophils Relative: 0.6 % (ref 0.0–3.0)
EOS ABS: 0.4 10*3/uL (ref 0.0–0.7)
Eosinophils Relative: 7.3 % — ABNORMAL HIGH (ref 0.0–5.0)
HCT: 37.6 % (ref 36.0–46.0)
HEMOGLOBIN: 12.5 g/dL (ref 12.0–15.0)
LYMPHS ABS: 1.2 10*3/uL (ref 0.7–4.0)
Lymphocytes Relative: 23.3 % (ref 12.0–46.0)
MCHC: 33.2 g/dL (ref 30.0–36.0)
MCV: 89.1 fl (ref 78.0–100.0)
MONOS PCT: 5.3 % (ref 3.0–12.0)
Monocytes Absolute: 0.3 10*3/uL (ref 0.1–1.0)
NEUTROS ABS: 3.3 10*3/uL (ref 1.4–7.7)
Neutrophils Relative %: 63.5 % (ref 43.0–77.0)
PLATELETS: 268 10*3/uL (ref 150.0–400.0)
RBC: 4.21 Mil/uL (ref 3.87–5.11)
RDW: 13.1 % (ref 11.5–15.5)
WBC: 5.2 10*3/uL (ref 4.0–10.5)

## 2013-12-28 LAB — HEPATIC FUNCTION PANEL
ALK PHOS: 61 U/L (ref 39–117)
ALT: 7 U/L (ref 0–35)
AST: 17 U/L (ref 0–37)
Albumin: 4 g/dL (ref 3.5–5.2)
Bilirubin, Direct: 0 mg/dL (ref 0.0–0.3)
TOTAL PROTEIN: 6.7 g/dL (ref 6.0–8.3)
Total Bilirubin: 0.6 mg/dL (ref 0.2–1.2)

## 2013-12-28 LAB — POCT URINALYSIS DIPSTICK
Bilirubin, UA: NEGATIVE
Glucose, UA: NEGATIVE
KETONES UA: NEGATIVE
Nitrite, UA: NEGATIVE
PROTEIN UA: NEGATIVE
SPEC GRAV UA: 1.02
Urobilinogen, UA: 0.2
pH, UA: 5.5

## 2013-12-29 LAB — TSH: TSH: 8.81 u[IU]/mL — ABNORMAL HIGH (ref 0.35–4.50)

## 2013-12-30 ENCOUNTER — Ambulatory Visit: Payer: PRIVATE HEALTH INSURANCE

## 2013-12-30 DIAGNOSIS — E039 Hypothyroidism, unspecified: Secondary | ICD-10-CM

## 2013-12-30 LAB — T3, FREE: T3, Free: 2.7 pg/mL (ref 2.3–4.2)

## 2013-12-30 LAB — T4, FREE: Free T4: 0.65 ng/dL (ref 0.60–1.60)

## 2014-01-05 ENCOUNTER — Encounter: Payer: Self-pay | Admitting: Family Medicine

## 2014-01-05 ENCOUNTER — Other Ambulatory Visit: Payer: Self-pay | Admitting: *Deleted

## 2014-01-05 ENCOUNTER — Ambulatory Visit (INDEPENDENT_AMBULATORY_CARE_PROVIDER_SITE_OTHER): Payer: PRIVATE HEALTH INSURANCE | Admitting: Family Medicine

## 2014-01-05 VITALS — BP 130/90 | Temp 98.4°F | Ht 67.75 in | Wt 161.0 lb

## 2014-01-05 DIAGNOSIS — E039 Hypothyroidism, unspecified: Secondary | ICD-10-CM

## 2014-01-05 DIAGNOSIS — R51 Headache: Secondary | ICD-10-CM

## 2014-01-05 DIAGNOSIS — Z860100 Personal history of colon polyps, unspecified: Secondary | ICD-10-CM

## 2014-01-05 DIAGNOSIS — J45909 Unspecified asthma, uncomplicated: Secondary | ICD-10-CM

## 2014-01-05 DIAGNOSIS — J309 Allergic rhinitis, unspecified: Secondary | ICD-10-CM

## 2014-01-05 DIAGNOSIS — K219 Gastro-esophageal reflux disease without esophagitis: Secondary | ICD-10-CM

## 2014-01-05 MED ORDER — LEVOTHYROXINE SODIUM 50 MCG PO TABS: 50.0000 ug | ORAL_TABLET | Freq: Every day | ORAL | Status: DC

## 2014-01-05 MED ORDER — CITALOPRAM HYDROBROMIDE 20 MG PO TABS: ORAL_TABLET | ORAL | Status: DC

## 2014-01-05 NOTE — Progress Notes (Signed)
Pre visit review using our clinic review tool, if applicable. No additional management support is needed unless otherwise documented below in the visit note. 

## 2014-01-05 NOTE — Patient Instructions (Signed)
Begin Synthroid 50 mcg daily  Office visit in 2 months........ nonfasting TSH level one week prior  Continue your other medications as outlined.

## 2014-01-05 NOTE — Progress Notes (Signed)
   Subjective:    Patient ID: [name] [name], female    DOB: [birth year], 53 y.o.   MRN: [id number]  HPI [name] [name] is a 53 year old married female nonsmoker who comes in today for general physical examination because of a history of allergic rhinitis, asthma, mild depression, migraine headaches  She states overall she's had a fairly good year except for some family issues. Her nephew get killed in an auto accident in her mother died. She then had to put her father in a nursing home.  Med list reviewed there've been no changes. She also sees Dr. Annamaria Boots on a regular basis for followup of her asthma. She says she's doing well as had no flares this   She gets routine eye care, dental care, BSE monthly, and you mammography, said to colonoscopy because of family history colon cancer. She's had polyps removed. Pelvic and Pap by GYN Dr. Matthew Saras. LMP was in 2015. She does not recall when. She had stopped for about 3 or 4 months and have another normal.  Vaccinations updated by Apolonio Schneiders    Review of Systems  Constitutional: Negative.   HENT: Negative.   Eyes: Negative.   Respiratory: Negative.   Cardiovascular: Negative.   Gastrointestinal: Negative.   Genitourinary: Negative.   Musculoskeletal: Negative.   Neurological: Negative.   Psychiatric/Behavioral: Negative.        Objective:   Physical Exam  Nursing note and vitals reviewed. Constitutional: She appears well-developed and well-nourished.  HENT:  Head: Normocephalic and atraumatic.  Right Ear: External ear normal.  Left Ear: External ear normal.  Nose: Nose normal.  Mouth/Throat: Oropharynx is clear and moist.  Eyes: EOM are normal. Pupils are equal, round, and reactive to light.  Neck: Normal range of motion. Neck supple. No thyromegaly present.  Cardiovascular: Normal rate, regular rhythm, normal heart sounds and intact distal pulses.  Exam reveals no gallop and no friction rub.   No murmur heard. Pulmonary/Chest: Effort normal and  breath sounds normal.  Abdominal: Soft. Bowel sounds are normal. She exhibits no distension and no mass. There is no tenderness. There is no rebound.  Genitourinary:  Bilateral breast exam normal  Musculoskeletal: Normal range of motion.  Lymphadenopathy:    She has no cervical adenopathy.  Neurological: She is alert. She has normal reflexes. No cranial nerve deficit. She exhibits normal muscle tone. Coordination normal.  Skin: Skin is warm and dry.  Total body skin exam normal  Psychiatric: She has a normal mood and affect. Her behavior is normal. Judgment and thought content normal.   healthy female  Asthma continue current medications  Allergic rhinitis continue current medications  History of mild depression continue Celexa 20 mg daily  Migraine headaches asymptomatic times one year  Abnormal TSH level VIII.81 with normal T4 and T3......... start Synthroid 25 mcg daily followup in 2 months        Assessment & Plan:

## 2014-01-25 ENCOUNTER — Other Ambulatory Visit: Payer: Self-pay | Admitting: Family Medicine

## 2014-01-26 ENCOUNTER — Telehealth: Payer: Self-pay | Admitting: Family Medicine

## 2014-01-26 NOTE — Telephone Encounter (Signed)
Pt said her dad was positive for MRSA. He lives in a retirement home and he is 40. He was admitted to the hospital today . Pt would like to know if her and her husband need to be tested and would like a call back on cell phone number [phone]

## 2014-01-26 NOTE — Telephone Encounter (Signed)
Per Dr Sherren Mocha patient does not need testing at this time and patient is aware.

## 2014-02-17 ENCOUNTER — Other Ambulatory Visit: Payer: PRIVATE HEALTH INSURANCE

## 2014-02-19 ENCOUNTER — Other Ambulatory Visit (INDEPENDENT_AMBULATORY_CARE_PROVIDER_SITE_OTHER): Payer: PRIVATE HEALTH INSURANCE

## 2014-02-19 ENCOUNTER — Other Ambulatory Visit: Payer: PRIVATE HEALTH INSURANCE

## 2014-02-19 DIAGNOSIS — E039 Hypothyroidism, unspecified: Secondary | ICD-10-CM

## 2014-02-19 LAB — TSH: TSH: 4.12 u[IU]/mL (ref 0.35–4.50)

## 2014-02-23 ENCOUNTER — Encounter: Payer: Self-pay | Admitting: Family Medicine

## 2014-02-23 ENCOUNTER — Ambulatory Visit (INDEPENDENT_AMBULATORY_CARE_PROVIDER_SITE_OTHER): Payer: PRIVATE HEALTH INSURANCE | Admitting: Family Medicine

## 2014-02-23 VITALS — BP 130/80 | Temp 98.3°F | Wt 161.0 lb

## 2014-02-23 DIAGNOSIS — E039 Hypothyroidism, unspecified: Secondary | ICD-10-CM

## 2014-02-23 NOTE — Progress Notes (Signed)
Pre visit review using our clinic review tool, if applicable. No additional management support is needed unless otherwise documented below in the visit note. 

## 2014-02-23 NOTE — Progress Notes (Signed)
   Subjective:    Patient ID: LISEL SIEGRIST, female    DOB: 12-05-1960, 53 y.o.   MRN: [id number]  HPI Margaux is a 53 year old female who comes in today for follow-up of hypothyroidism  She had an elevated TSH level over 8 months ago and we started on Synthroid 25 g daily. She comes back today for follow-up. She feels well and her TSH level was within normal limits  Her headaches are persistent however she's had a lot of stress. Her mother died in 16-Nov-2022 her father's been in a nursing home and was recently admitted to the hospital for evaluation because a nursing home felt like they couldn't wake him up. She says had 3 miserable days in the hospital following back to the nursing home now they have hospice. He is 52 years old with multiple medical problems and she is the primary caregiver   Review of Systems    review of systems otherwise negative Objective:   Physical Exam  Well-developed well-nourished female no acute distress vital signs stable she's afebrile      Assessment & Plan:  Hypothyroidism mild............ Continue Synthroid 25 g daily follow-up TSH level and physical and 10 months

## 2014-02-23 NOTE — Patient Instructions (Signed)
Continue your current medication   Follow-up in 10 months sooner if any problems

## 2014-05-10 ENCOUNTER — Other Ambulatory Visit: Payer: Self-pay

## 2014-05-10 DIAGNOSIS — Z1231 Encounter for screening mammogram for malignant neoplasm of breast: Secondary | ICD-10-CM

## 2014-06-15 ENCOUNTER — Ambulatory Visit
Admission: RE | Admit: 2014-06-15 | Discharge: 2014-06-15 | Disposition: A | Discharge status: Home / Self Care | Payer: PRIVATE HEALTH INSURANCE | Source: Ambulatory Visit

## 2014-06-15 ENCOUNTER — Other Ambulatory Visit: Payer: Self-pay | Admitting: Obstetrics and Gynecology

## 2014-06-15 DIAGNOSIS — Z1231 Encounter for screening mammogram for malignant neoplasm of breast: Secondary | ICD-10-CM

## 2014-06-16 LAB — CYTOLOGY - PAP

## 2014-06-17 ENCOUNTER — Other Ambulatory Visit: Payer: Self-pay | Admitting: Dermatology

## 2014-08-26 ENCOUNTER — Encounter: Payer: Self-pay | Admitting: Family Medicine

## 2014-08-26 ENCOUNTER — Ambulatory Visit (INDEPENDENT_AMBULATORY_CARE_PROVIDER_SITE_OTHER): Payer: PRIVATE HEALTH INSURANCE | Admitting: Family Medicine

## 2014-08-26 VITALS — BP 120/80 | Temp 98.2°F | Wt 163.0 lb

## 2014-08-26 DIAGNOSIS — R413 Other amnesia: Secondary | ICD-10-CM | POA: Diagnosis not present

## 2014-08-26 DIAGNOSIS — J301 Allergic rhinitis due to pollen: Secondary | ICD-10-CM | POA: Diagnosis not present

## 2014-08-26 LAB — BASIC METABOLIC PANEL
BUN: 15 mg/dL (ref 6–23)
CALCIUM: 9.6 mg/dL (ref 8.4–10.5)
CO2: 29 meq/L (ref 19–32)
CREATININE: 0.74 mg/dL (ref 0.40–1.20)
Chloride: 103 mEq/L (ref 96–112)
GFR: 87.46 mL/min (ref >60.00)
GLUCOSE: 85 mg/dL (ref 70–99)
Potassium: 3.6 mEq/L (ref 3.5–5.1)
Sodium: 138 mEq/L (ref 135–145)

## 2014-08-26 LAB — CBC WITH DIFFERENTIAL/PLATELET
Basophils Absolute: 0 10*3/uL (ref 0.0–0.1)
Basophils Relative: 0.5 % (ref 0.0–3.0)
EOS PCT: 3.3 % (ref 0.0–5.0)
Eosinophils Absolute: 0.2 10*3/uL (ref 0.0–0.7)
HCT: 39.3 % (ref 36.0–46.0)
Hemoglobin: 13.2 g/dL (ref 12.0–15.0)
LYMPHS PCT: 22.7 % (ref 12.0–46.0)
Lymphs Abs: 1.2 10*3/uL (ref 0.7–4.0)
MCHC: 33.5 g/dL (ref 30.0–36.0)
MCV: 86.6 fl (ref 78.0–100.0)
MONOS PCT: 5.6 % (ref 3.0–12.0)
Monocytes Absolute: 0.3 10*3/uL (ref 0.1–1.0)
Neutro Abs: 3.5 10*3/uL (ref 1.4–7.7)
Neutrophils Relative %: 67.9 % (ref 43.0–77.0)
PLATELETS: 284 10*3/uL (ref 150.0–400.0)
RBC: 4.55 Mil/uL (ref 3.87–5.11)
RDW: 12.9 % (ref 11.5–15.5)
WBC: 5.2 10*3/uL (ref 4.0–10.5)

## 2014-08-26 LAB — VITAMIN B12: Vitamin B-12: 317 pg/mL (ref 211–911)

## 2014-08-26 LAB — TSH: TSH: 3.74 u[IU]/mL (ref 0.35–4.50)

## 2014-08-26 NOTE — Progress Notes (Signed)
Pre visit review using our clinic review tool, if applicable. No additional management support is needed unless otherwise documented below in the visit note. 

## 2014-08-26 NOTE — Progress Notes (Signed)
   Subjective:    Patient ID: [name] [name], female    DOB: 10-12-1960, 54 y.o.   MRN: [id number]  HPI [name] [name] is a 54 year old married female nonsmoker who comes in today for evaluation of 3 problems  She's noticed some last couple months short-term memory problems. Her father was diagnosed to have Alzheimer's disease at age 69. Long-term memory intact.  She has a history of allergic rhinitis and asthma and is on a lot of medications and has a lot of snoring with her nasal congestion  She seen her dermatologist and is on doxycycline 100 mg daily for psoriasis. She also has some soreness in her left knee. 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      Assessment & Plan:  History of psoriasis treated with doxycycline by her dermatologist  Short-term memory changes........ neuro evaluation  Psoriatic arthritis.......Marland Kitchen Motrin 400 twice a day when necessary  Snoring from allergic rhinitis.......Marland Kitchen

## 2014-08-26 NOTE — Patient Instructions (Signed)
Steroid nasal spray..... One shot up each nostril at bedtime...Marland KitchenMarland KitchenMarland Kitchen  Afrin nasal spray.......Marland Kitchen 1 shot up each nostril at bedtime....... however this has a 5 night limit  Neuro consult for evaluation of the memory issue  Motrin 400 mg twice a day when necessary for joint pain

## 2014-10-21 ENCOUNTER — Ambulatory Visit: Payer: PRIVATE HEALTH INSURANCE | Admitting: Neurology

## 2014-12-06 ENCOUNTER — Other Ambulatory Visit: Payer: Self-pay | Admitting: Family Medicine

## 2015-01-12 ENCOUNTER — Other Ambulatory Visit: Payer: Self-pay | Admitting: Family Medicine

## 2015-01-12 ENCOUNTER — Other Ambulatory Visit (INDEPENDENT_AMBULATORY_CARE_PROVIDER_SITE_OTHER): Payer: PRIVATE HEALTH INSURANCE

## 2015-01-12 DIAGNOSIS — Z0000 Encounter for general adult medical examination without abnormal findings: Secondary | ICD-10-CM | POA: Diagnosis not present

## 2015-01-12 LAB — HEPATIC FUNCTION PANEL
ALBUMIN: 4.2 g/dL (ref 3.5–5.2)
ALT: 8 U/L (ref 0–35)
AST: 16 U/L (ref 0–37)
Alkaline Phosphatase: 62 U/L (ref 39–117)
BILIRUBIN TOTAL: 0.4 mg/dL (ref 0.2–1.2)
Bilirubin, Direct: 0.1 mg/dL (ref 0.0–0.3)
Total Protein: 6.8 g/dL (ref 6.0–8.3)

## 2015-01-12 LAB — CBC WITH DIFFERENTIAL/PLATELET
BASOS PCT: 0.5 % (ref 0.0–3.0)
Basophils Absolute: 0 10*3/uL (ref 0.0–0.1)
EOS PCT: 5.3 % — AB (ref 0.0–5.0)
Eosinophils Absolute: 0.3 10*3/uL (ref 0.0–0.7)
HCT: 39.7 % (ref 36.0–46.0)
Hemoglobin: 13.2 g/dL (ref 12.0–15.0)
LYMPHS ABS: 1.2 10*3/uL (ref 0.7–4.0)
Lymphocytes Relative: 24.2 % (ref 12.0–46.0)
MCHC: 33.2 g/dL (ref 30.0–36.0)
MCV: 88.2 fl (ref 78.0–100.0)
MONO ABS: 0.3 10*3/uL (ref 0.1–1.0)
Monocytes Relative: 5.5 % (ref 3.0–12.0)
NEUTROS ABS: 3.2 10*3/uL (ref 1.4–7.7)
NEUTROS PCT: 64.5 % (ref 43.0–77.0)
Platelets: 287 10*3/uL (ref 150.0–400.0)
RBC: 4.49 Mil/uL (ref 3.87–5.11)
RDW: 13.1 % (ref 11.5–15.5)
WBC: 4.9 10*3/uL (ref 4.0–10.5)

## 2015-01-12 LAB — BASIC METABOLIC PANEL
BUN: 17 mg/dL (ref 6–23)
CHLORIDE: 104 meq/L (ref 96–112)
CO2: 31 meq/L (ref 19–32)
Calcium: 9.4 mg/dL (ref 8.4–10.5)
Creatinine, Ser: 0.79 mg/dL (ref 0.40–1.20)
GFR: 80.98 mL/min (ref >60.00)
GLUCOSE: 90 mg/dL (ref 70–99)
POTASSIUM: 4 meq/L (ref 3.5–5.1)
Sodium: 141 mEq/L (ref 135–145)

## 2015-01-12 LAB — POCT URINALYSIS DIPSTICK
Bilirubin, UA: NEGATIVE
Glucose, UA: NEGATIVE
KETONES UA: NEGATIVE
NITRITE UA: NEGATIVE
PROTEIN UA: NEGATIVE
RBC UA: NEGATIVE
Spec Grav, UA: 1.02
Urobilinogen, UA: 0.2
pH, UA: 7.5

## 2015-01-12 LAB — LIPID PANEL
CHOL/HDL RATIO: 3
Cholesterol: 224 mg/dL — ABNORMAL HIGH (ref 0–200)
HDL: 70 mg/dL (ref >39.00)
LDL Cholesterol: 136 mg/dL — ABNORMAL HIGH (ref 0–99)
NONHDL: 153.62
TRIGLYCERIDES: 86 mg/dL (ref 0.0–149.0)
VLDL: 17.2 mg/dL (ref 0.0–40.0)

## 2015-01-12 LAB — TSH: TSH: 4.98 u[IU]/mL — AB (ref 0.35–4.50)

## 2015-01-18 ENCOUNTER — Ambulatory Visit (INDEPENDENT_AMBULATORY_CARE_PROVIDER_SITE_OTHER): Payer: PRIVATE HEALTH INSURANCE | Admitting: Family Medicine

## 2015-01-18 ENCOUNTER — Encounter: Payer: Self-pay | Admitting: Family Medicine

## 2015-01-18 VITALS — BP 120/82 | HR 84 | Temp 98.1°F | Ht 65.0 in | Wt 165.0 lb

## 2015-01-18 DIAGNOSIS — R51 Headache: Secondary | ICD-10-CM | POA: Diagnosis not present

## 2015-01-18 DIAGNOSIS — J301 Allergic rhinitis due to pollen: Secondary | ICD-10-CM | POA: Diagnosis not present

## 2015-01-18 DIAGNOSIS — Z0000 Encounter for general adult medical examination without abnormal findings: Secondary | ICD-10-CM

## 2015-01-18 DIAGNOSIS — J4520 Mild intermittent asthma, uncomplicated: Secondary | ICD-10-CM

## 2015-01-18 DIAGNOSIS — G8929 Other chronic pain: Secondary | ICD-10-CM

## 2015-01-18 DIAGNOSIS — E039 Hypothyroidism, unspecified: Secondary | ICD-10-CM

## 2015-01-18 MED ORDER — MONTELUKAST SODIUM 10 MG PO TABS
10.0000 mg | ORAL_TABLET | Freq: Every day | ORAL | Status: DC
Start: 2015-01-18 — End: 2016-02-13

## 2015-01-18 MED ORDER — FLUTICASONE-SALMETEROL 100-50 MCG/DOSE IN AEPB: INHALATION_SPRAY | RESPIRATORY_TRACT | Status: DC

## 2015-01-18 MED ORDER — CITALOPRAM HYDROBROMIDE 20 MG PO TABS: 20.0000 mg | ORAL_TABLET | Freq: Every day | ORAL | Status: DC

## 2015-01-18 MED ORDER — LEVOTHYROXINE SODIUM 75 MCG PO TABS: 75.0000 ug | ORAL_TABLET | Freq: Every day | ORAL | Status: DC

## 2015-01-18 NOTE — Patient Instructions (Addendum)
Continue current medication..... Except increase your Synthroid to 75 g daily  Follow-up in one year sooner if any problem,,,,,,,,,,,

## 2015-01-18 NOTE — Progress Notes (Signed)
Subjective:    Patient ID: [name] [name], female    DOB: 1960/11/16, 54 y.o.   MRN: [id number]  HPI  [name] [name] is a 54 year old married female nonsmoker G2 P2,,,,,,, 2 teenage boys,,,,, who works in radiology,,,,,, who comes in today for general physical examination because of a history of allergic rhinitis, asthma, hypothyroidism and migraine headaches  Her allergic rhinitis is treated with Singulair 10 mg daily along with Zyrtec 10 mg at bedtime  She takes Synthroid 50 g for hypothyroidism. Recent TSH level IV.98.  She also takes Advair 100-50 dose one puff twice a day for asthma. At this juncture she only does it in the spring and fall. Summer and winter asthma is quiet.  She uses Maxalt when necessary for migraine headaches. She has not had to use any in the past year.  She also has a history of mild depression for which she takes Celexa 20 mg at bedtime.  She's not taking aspirin because she is off her BCPs. She went to see her GYN had a complete evaluation February. She does have trouble with dry skin dry vagina and hot flushes. Advised her to call her GYN for evaluation of that problem since she's been there already for a Pap  She gets routine eye care.......Marland Kitchen recent bilateral cataract extraction and lens implants.. Regular dental care, BSE monthly, annual mammography, colonoscopy was done 2012 showed some polyps he's due to go back in 2017  Vaccinations reviewed she is up-to-date she had a seasonal flu shot at work   Review of Systems  Constitutional: Negative.   HENT: Negative.   Eyes: Negative.   Respiratory: Negative.   Cardiovascular: Negative.   Gastrointestinal: Negative.   Endocrine: Negative.   Genitourinary: Negative.   Musculoskeletal: Negative.   Skin: Negative.   Allergic/Immunologic: Negative.   Neurological: Negative.   Hematological: Negative.   Psychiatric/Behavioral: Negative.        Objective:   Physical Exam  Constitutional: She appears  well-developed and well-nourished.  HENT:  Head: Normocephalic and atraumatic.  Right Ear: External ear normal.  Left Ear: External ear normal.  Nose: Nose normal.  Mouth/Throat: Oropharynx is clear and moist.  Eyes: EOM are normal. Pupils are equal, round, and reactive to light.  Neck: Normal range of motion. Neck supple. No JVD present. No tracheal deviation present. No thyromegaly present.  Cardiovascular: Normal rate, regular rhythm, normal heart sounds and intact distal pulses.  Exam reveals no gallop and no friction rub.   No murmur heard. Pulmonary/Chest: Effort normal and breath sounds normal. No stridor. No respiratory distress. She has no wheezes. She has no rales. She exhibits no tenderness.  Abdominal: Soft. Bowel sounds are normal. She exhibits no distension and no mass. There is no tenderness. There is no rebound and no guarding.  Genitourinary:  Bilateral breast exam normal  Musculoskeletal: Normal range of motion.  Lymphadenopathy:    She has no cervical adenopathy.  Neurological: She is alert. She has normal reflexes. No cranial nerve deficit. She exhibits normal muscle tone. Coordination normal.  Skin: Skin is warm and dry. No rash noted. No erythema. No pallor.  Psychiatric: She has a normal mood and affect. Her behavior is normal. Judgment and thought content normal.  Nursing note and vitals reviewed.         Assessment & Plan:  Healthy female  History of asthma continue current therapy  Allergic rhinitis continue current therapy  History of mild depression continue Celexa 20 mg daily  Hypothyroidism decrease  Synthroid to 75 g daily  Migraine headaches quiet no refill on her Maxalt because she has not had any migraines and she Lynann Bologna has some at home  History of dysfunction uterine bleeding off BCPs follow-up by GYN

## 2015-05-16 ENCOUNTER — Other Ambulatory Visit: Payer: Self-pay

## 2015-05-16 DIAGNOSIS — Z1231 Encounter for screening mammogram for malignant neoplasm of breast: Secondary | ICD-10-CM

## 2015-06-17 ENCOUNTER — Ambulatory Visit
Admission: RE | Admit: 2015-06-17 | Discharge: 2015-06-17 | Disposition: A | Discharge status: Home / Self Care | Payer: PRIVATE HEALTH INSURANCE | Source: Ambulatory Visit

## 2015-06-17 DIAGNOSIS — Z1231 Encounter for screening mammogram for malignant neoplasm of breast: Secondary | ICD-10-CM

## 2016-01-25 ENCOUNTER — Encounter: Payer: Self-pay | Admitting: Gastroenterology

## 2016-02-13 ENCOUNTER — Other Ambulatory Visit: Payer: Self-pay | Admitting: Family Medicine

## 2016-02-16 ENCOUNTER — Telehealth: Payer: Self-pay | Admitting: Internal Medicine

## 2016-02-16 NOTE — Telephone Encounter (Signed)
That is ok with me. Thanks

## 2016-02-17 ENCOUNTER — Encounter: Payer: Self-pay | Admitting: Gastroenterology

## 2016-02-17 NOTE — Telephone Encounter (Signed)
Left message with female. He states patient will call to schedule.

## 2016-04-13 ENCOUNTER — Ambulatory Visit (AMBULATORY_SURGERY_CENTER): Payer: Self-pay | Admitting: *Deleted

## 2016-04-13 VITALS — Ht 65.0 in | Wt 160.4 lb

## 2016-04-13 DIAGNOSIS — Z86010 Personal history of colonic polyps: Secondary | ICD-10-CM

## 2016-04-13 DIAGNOSIS — Z800 Family history of malignant neoplasm of digestive organs: Secondary | ICD-10-CM

## 2016-04-13 MED ORDER — NA SULFATE-K SULFATE-MG SULF 17.5-3.13-1.6 GM/177ML PO SOLN: ORAL | 0 refills | Status: DC

## 2016-04-13 NOTE — Progress Notes (Signed)
Pt denies allergies to eggs or soy products. Denies difficulty with sedation or anesthesia. Denies any diet or weight loss medications. Denies use of supplemental oxygen.  Emmi instructions given for procedure.  

## 2016-04-27 ENCOUNTER — Ambulatory Visit (AMBULATORY_SURGERY_CENTER): Payer: PRIVATE HEALTH INSURANCE | Admitting: Gastroenterology

## 2016-04-27 ENCOUNTER — Encounter: Payer: Self-pay | Admitting: Gastroenterology

## 2016-04-27 VITALS — BP 119/74 | HR 65 | Temp 98.0°F | Resp 15 | Ht 65.0 in | Wt 160.0 lb

## 2016-04-27 DIAGNOSIS — Z800 Family history of malignant neoplasm of digestive organs: Secondary | ICD-10-CM

## 2016-04-27 DIAGNOSIS — D123 Benign neoplasm of transverse colon: Secondary | ICD-10-CM | POA: Diagnosis not present

## 2016-04-27 DIAGNOSIS — K635 Polyp of colon: Secondary | ICD-10-CM | POA: Diagnosis not present

## 2016-04-27 DIAGNOSIS — Z860100 Personal history of colon polyps, unspecified: Secondary | ICD-10-CM

## 2016-04-27 DIAGNOSIS — D125 Benign neoplasm of sigmoid colon: Secondary | ICD-10-CM

## 2016-04-27 DIAGNOSIS — D122 Benign neoplasm of ascending colon: Secondary | ICD-10-CM

## 2016-04-27 DIAGNOSIS — D126 Benign neoplasm of colon, unspecified: Secondary | ICD-10-CM | POA: Diagnosis not present

## 2016-04-27 DIAGNOSIS — D124 Benign neoplasm of descending colon: Secondary | ICD-10-CM | POA: Diagnosis not present

## 2016-04-27 MED ORDER — SODIUM CHLORIDE 0.9 % IV SOLN: 500.0000 mL | INTRAVENOUS | Status: DC

## 2016-04-27 NOTE — Op Note (Signed)
[facility] Patient Name: [name] [name] Procedure Date: 04/27/2016 12:48 PM MRN: [id number] Endoscopist: Milus Banister , MD Age: 56 Referring MD:  Date of Birth: January 12, 1961 Gender: Female Account #: [id number] Procedure:                Colonoscopy Indications:              High risk colon cancer surveillance: Personal                            history of colonic polyps (three polyps 2012, two                            adenomas, one ssa); Family history of colon cancer                            in a first-degree relative (father diagnosed in his                            70s) Medicines:                Monitored Anesthesia Care Procedure:                Pre-Anesthesia Assessment:                           - Prior to the procedure, a History and Physical                            was performed, and patient medications and                            allergies were reviewed. The patient's tolerance of                            previous anesthesia was also reviewed. The risks                            and benefits of the procedure and the sedation                            options and risks were discussed with the patient.                            All questions were answered, and informed consent                            was obtained. Prior Anticoagulants: The patient has                            taken no previous anticoagulant or antiplatelet                            agents. ASA Grade Assessment: II - A patient with  mild systemic disease. After reviewing the risks                            and benefits, the patient was deemed in                            satisfactory condition to undergo the procedure.                           After obtaining informed consent, the colonoscope                            was passed under direct vision. Throughout the                            procedure, the patient's blood pressure, pulse, and                      oxygen saturations were monitored continuously. The                            Model CF-H180AL [id number]) scope was introduced                            through the anus and advanced to the the cecum,                            identified by appendiceal orifice and ileocecal                            valve. The colonoscopy was performed without                            difficulty. The patient tolerated the procedure                            well. The quality of the bowel preparation was                            excellent. The ileocecal valve, appendiceal                            orifice, and rectum were photographed. Scope In: 12:57:03 PM Scope Out: 1:09:24 PM Scope Withdrawal Time: 0 hours 9 minutes 13 seconds  Total Procedure Duration: 0 hours 12 minutes 21 seconds  Findings:                 Six sessile polyps were found in the sigmoid colon,                            descending colon and transverse colon. The polyps                            were 3 to 5 mm in size. These polyps were removed  with a cold snare. Resection and retrieval were                            complete.                           The exam was otherwise without abnormality on                            direct and retroflexion views. Complications:            No immediate complications. Estimated blood loss:                            None. Estimated Blood Loss:     Estimated blood loss: none. Impression:               - Six 3 to 5 mm polyps in the sigmoid colon, in the                            descending colon and in the transverse colon,                            removed with a cold snare. Resected and retrieved.                           - The examination was otherwise normal on direct                            and retroflexion views. Recommendation:           - Patient has a contact number available for                            emergencies. The  signs and symptoms of potential                            delayed complications were discussed with the                            patient. Return to normal activities tomorrow.                            Written discharge instructions were provided to the                            patient.                           - Resume previous diet.                           - Continue present medications.                           You will receive a letter within 2-3 weeks with the  pathology results and my final recommendations.                           If the polyp(s) is proven to be 'pre-cancerous' on                            pathology, you will need repeat colonoscopy in 3-5                            years. Milus Banister, MD 04/27/2016 1:12:06 PM This report has been signed electronically.

## 2016-04-27 NOTE — Progress Notes (Signed)
Called to room to assist during endoscopic procedure.  Patient ID and intended procedure confirmed with present staff. Received instructions for my participation in the procedure from the performing physician.  

## 2016-04-27 NOTE — Progress Notes (Signed)
A and O x3. Report to RN. Tolerated MAC anesthesia well.

## 2016-04-27 NOTE — Patient Instructions (Signed)
Discharge instructions given. Handout on polyps. Resume previous medications. YOU HAD AN ENDOSCOPIC PROCEDURE TODAY AT THE [facility]:   Refer to the procedure report that was given to you for any specific questions about what was found during the examination.  If the procedure report does not answer your questions, please call your gastroenterologist to clarify.  If you requested that your care partner not be given the details of your procedure findings, then the procedure report has been included in a sealed envelope for you to review at your convenience later.  YOU SHOULD EXPECT: Some feelings of bloating in the abdomen. Passage of more gas than usual.  Walking can help get rid of the air that was put into your GI tract during the procedure and reduce the bloating. If you had a lower endoscopy (such as a colonoscopy or flexible sigmoidoscopy) you may notice spotting of blood in your stool or on the toilet paper. If you underwent a bowel prep for your procedure, you may not have a normal bowel movement for a few days.  Please Note:  You might notice some irritation and congestion in your nose or some drainage.  This is from the oxygen used during your procedure.  There is no need for concern and it should clear up in a day or so.  SYMPTOMS TO REPORT IMMEDIATELY:   Following lower endoscopy (colonoscopy or flexible sigmoidoscopy):  Excessive amounts of blood in the stool  Significant tenderness or worsening of abdominal pains  Swelling of the abdomen that is new, acute  Fever of 100F or higher   For urgent or emergent issues, a gastroenterologist can be reached at any hour by calling [phone].   DIET:  We do recommend a small meal at first, but then you may proceed to your regular diet.  Drink plenty of fluids but you should avoid alcoholic beverages for 24 hours.  ACTIVITY:  You should plan to take it easy for the rest of today and you should NOT DRIVE or use heavy  machinery until tomorrow (because of the sedation medicines used during the test).    FOLLOW UP: Our staff will call the number listed on your records the next business day following your procedure to check on you and address any questions or concerns that you may have regarding the information given to you following your procedure. If we do not reach you, we will leave a message.  However, if you are feeling well and you are not experiencing any problems, there is no need to return our call.  We will assume that you have returned to your regular daily activities without incident.  If any biopsies were taken you will be contacted by phone or by letter within the next 1-3 weeks.  Please call us at [phone] if you have not heard about the biopsies in 3 weeks.    SIGNATURES/CONFIDENTIALITY: You and/or your care partner have signed paperwork which will be entered into your electronic medical record.  These signatures attest to the fact that that the information above on your After Visit Summary has been reviewed and is understood.  Full responsibility of the confidentiality of this discharge information lies with you and/or your care-partner. 

## 2016-04-30 ENCOUNTER — Telehealth: Payer: Self-pay

## 2016-04-30 NOTE — Telephone Encounter (Signed)
  Follow up Call-  Call back number 04/27/2016  Post procedure Call Back phone  # [phone]  Permission to leave phone message Yes  Some recent data might be hidden     Patient questions:  Do you have a fever, pain , or abdominal swelling? No. Pain Score  0 *  Have you tolerated food without any problems? Yes.    Have you been able to return to your normal activities? Yes.    Do you have any questions about your discharge instructions: Diet   No. Medications  No. Follow up visit  No.  Do you have questions or concerns about your Care? No.  Actions: * If pain score is 4 or above: No action needed, pain <4.

## 2016-05-01 ENCOUNTER — Encounter: Payer: Self-pay | Admitting: Gastroenterology

## 2016-05-21 ENCOUNTER — Other Ambulatory Visit: Payer: Self-pay | Admitting: Obstetrics and Gynecology

## 2016-05-21 DIAGNOSIS — Z1231 Encounter for screening mammogram for malignant neoplasm of breast: Secondary | ICD-10-CM

## 2016-06-18 ENCOUNTER — Ambulatory Visit
Admission: RE | Admit: 2016-06-18 | Discharge: 2016-06-18 | Disposition: A | Discharge status: Home / Self Care | Payer: PRIVATE HEALTH INSURANCE | Source: Ambulatory Visit | Attending: Obstetrics and Gynecology | Admitting: Obstetrics and Gynecology

## 2016-06-18 DIAGNOSIS — Z1231 Encounter for screening mammogram for malignant neoplasm of breast: Secondary | ICD-10-CM

## 2016-09-21 ENCOUNTER — Other Ambulatory Visit: Payer: Self-pay | Admitting: Family Medicine

## 2016-09-21 NOTE — Telephone Encounter (Signed)
Denied.  Needs an appointment.  Not seen since 01/2015

## 2016-12-18 ENCOUNTER — Other Ambulatory Visit: Payer: Self-pay | Admitting: Family Medicine

## 2016-12-28 ENCOUNTER — Encounter: Payer: Self-pay | Admitting: Family Medicine

## 2017-01-13 ENCOUNTER — Other Ambulatory Visit: Payer: Self-pay | Admitting: Family Medicine

## 2017-05-29 ENCOUNTER — Other Ambulatory Visit: Payer: Self-pay | Admitting: Obstetrics and Gynecology

## 2017-05-29 DIAGNOSIS — Z139 Encounter for screening, unspecified: Secondary | ICD-10-CM

## 2017-06-19 ENCOUNTER — Ambulatory Visit
Admission: RE | Admit: 2017-06-19 | Discharge: 2017-06-19 | Disposition: A | Discharge status: Home / Self Care | Payer: PRIVATE HEALTH INSURANCE | Source: Ambulatory Visit | Attending: Obstetrics and Gynecology | Admitting: Obstetrics and Gynecology

## 2017-06-19 DIAGNOSIS — Z139 Encounter for screening, unspecified: Secondary | ICD-10-CM

## 2017-10-15 ENCOUNTER — Other Ambulatory Visit: Payer: Self-pay | Admitting: Family Medicine

## 2017-11-20 ENCOUNTER — Other Ambulatory Visit: Payer: Self-pay | Admitting: Family Medicine

## 2017-11-20 NOTE — Telephone Encounter (Signed)
Pt has been scheduled to see Dr Sherren Mocha on 12/11/2017 for refills of her Celexa. Pt voiced understanding that refill will be provided at the visit.

## 2017-12-11 ENCOUNTER — Encounter: Payer: Self-pay | Admitting: Family Medicine

## 2017-12-11 ENCOUNTER — Ambulatory Visit: Payer: PRIVATE HEALTH INSURANCE | Admitting: Family Medicine

## 2017-12-11 VITALS — BP 102/72 | HR 77 | Temp 98.4°F | Ht 65.0 in | Wt 167.7 lb

## 2017-12-11 DIAGNOSIS — J301 Allergic rhinitis due to pollen: Secondary | ICD-10-CM | POA: Diagnosis not present

## 2017-12-11 DIAGNOSIS — J4530 Mild persistent asthma, uncomplicated: Secondary | ICD-10-CM | POA: Diagnosis not present

## 2017-12-11 MED ORDER — FLUTICASONE-SALMETEROL 100-50 MCG/DOSE IN AEPB: INHALATION_SPRAY | RESPIRATORY_TRACT | 4 refills | Status: DC

## 2017-12-11 MED ORDER — AZELASTINE-FLUTICASONE 137-50 MCG/ACT NA SUSP: 1.0000 | Freq: Two times a day (BID) | NASAL | 4 refills | Status: DC

## 2017-12-11 MED ORDER — MONTELUKAST SODIUM 10 MG PO TABS: 10.0000 mg | ORAL_TABLET | Freq: Every day | ORAL | 4 refills | Status: DC

## 2017-12-11 MED ORDER — FLUTICASONE-SALMETEROL 100-50 MCG/DOSE IN AEPB: INHALATION_SPRAY | RESPIRATORY_TRACT | 4 refills | Status: AC

## 2017-12-11 MED ORDER — CITALOPRAM HYDROBROMIDE 20 MG PO TABS: 20.0000 mg | ORAL_TABLET | Freq: Every day | ORAL | 4 refills | Status: DC

## 2017-12-11 MED ORDER — MONTELUKAST SODIUM 10 MG PO TABS: 10.0000 mg | ORAL_TABLET | Freq: Every day | ORAL | 4 refills | Status: AC

## 2017-12-11 MED ORDER — AZELASTINE-FLUTICASONE 137-50 MCG/ACT NA SUSP: 1.0000 | Freq: Two times a day (BID) | NASAL | 4 refills | Status: AC

## 2017-12-11 NOTE — Patient Instructions (Signed)
Continue your current medications  Return when necessary

## 2017-12-11 NOTE — Progress Notes (Signed)
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 She uses occasional antihistamine.  She uses Astelin nasal spray when necessary  She has a history of long-term asthma. She uses Advair 1 puff twice a day and is asymptomatic. She's never had PFTs and declines  She also takes Celexa 20 mg daily at bedtime because of a history of mild depression  She sees her GYN and a yearly basis for physical examination Pap. She also went to integrative health in [facility] and is been treated there with vitamins estrogen and progesterone and natural thyroid.  She gets routine eye care....... Had both cataracts extremities by Dr. Carolynn Sayers last year with lens implants  She gets regular dental care BSE monthly and you mammography colonoscopy 2018 with normal  Vaccinations up-to-date. She gets seasonal flu shot at the hospital  BP 102/72 (BP Location: Left Arm, Patient Position: Sitting, Cuff Size: Normal)   Pulse 77   Temp 98.4 F (36.9 C) (Oral)   Ht 5\' 5"  (1.651 m)   Wt 167 lb 11.2 oz (76.1 kg)   LMP 02/16/2012   BMI 27.91 kg/m  Well-developed well-nourished female no acute distress vital signs stable she's afebrile HEENT were negative ,except for healed perforation left TM.   neck was supple thyroid not enlarged pulmonary exam normal  #1 allergic rhinitis......... Continue current treatment program  #2 asthma........... Continue current treatment program  #3 history of mild depression.......... Continue Celexa

## 2018-05-27 ENCOUNTER — Other Ambulatory Visit: Payer: Self-pay | Admitting: Obstetrics and Gynecology

## 2018-05-27 DIAGNOSIS — Z1231 Encounter for screening mammogram for malignant neoplasm of breast: Secondary | ICD-10-CM

## 2018-06-24 ENCOUNTER — Ambulatory Visit: Payer: PRIVATE HEALTH INSURANCE

## 2018-07-16 ENCOUNTER — Ambulatory Visit: Payer: PRIVATE HEALTH INSURANCE

## 2018-07-22 DIAGNOSIS — F329 Major depressive disorder, single episode, unspecified: Secondary | ICD-10-CM | POA: Diagnosis not present

## 2018-07-22 DIAGNOSIS — N951 Menopausal and female climacteric states: Secondary | ICD-10-CM | POA: Diagnosis not present

## 2018-07-22 DIAGNOSIS — J45909 Unspecified asthma, uncomplicated: Secondary | ICD-10-CM | POA: Diagnosis not present

## 2018-07-22 DIAGNOSIS — E039 Hypothyroidism, unspecified: Secondary | ICD-10-CM | POA: Diagnosis not present

## 2018-08-27 DIAGNOSIS — Z86018 Personal history of other benign neoplasm: Secondary | ICD-10-CM | POA: Diagnosis not present

## 2018-08-27 DIAGNOSIS — L304 Erythema intertrigo: Secondary | ICD-10-CM | POA: Diagnosis not present

## 2018-08-27 DIAGNOSIS — D2272 Melanocytic nevi of left lower limb, including hip: Secondary | ICD-10-CM | POA: Diagnosis not present

## 2018-08-27 DIAGNOSIS — D485 Neoplasm of uncertain behavior of skin: Secondary | ICD-10-CM | POA: Diagnosis not present

## 2018-08-27 DIAGNOSIS — D2261 Melanocytic nevi of right upper limb, including shoulder: Secondary | ICD-10-CM | POA: Diagnosis not present

## 2018-08-27 DIAGNOSIS — D225 Melanocytic nevi of trunk: Secondary | ICD-10-CM | POA: Diagnosis not present

## 2018-08-28 ENCOUNTER — Ambulatory Visit: Payer: PRIVATE HEALTH INSURANCE

## 2018-09-23 DIAGNOSIS — Z6827 Body mass index (BMI) 27.0-27.9, adult: Secondary | ICD-10-CM | POA: Diagnosis not present

## 2018-09-23 DIAGNOSIS — G43909 Migraine, unspecified, not intractable, without status migrainosus: Secondary | ICD-10-CM | POA: Insufficient documentation

## 2018-09-23 DIAGNOSIS — L309 Dermatitis, unspecified: Secondary | ICD-10-CM | POA: Insufficient documentation

## 2018-09-23 DIAGNOSIS — Z01419 Encounter for gynecological examination (general) (routine) without abnormal findings: Secondary | ICD-10-CM | POA: Diagnosis not present

## 2018-10-08 ENCOUNTER — Ambulatory Visit: Payer: PRIVATE HEALTH INSURANCE

## 2018-10-09 ENCOUNTER — Ambulatory Visit: Payer: PRIVATE HEALTH INSURANCE

## 2018-11-21 ENCOUNTER — Ambulatory Visit
Admission: RE | Admit: 2018-11-21 | Discharge: 2018-11-21 | Disposition: A | Discharge status: Home / Self Care | Payer: PRIVATE HEALTH INSURANCE | Source: Ambulatory Visit | Attending: Obstetrics and Gynecology | Admitting: Obstetrics and Gynecology

## 2018-11-21 ENCOUNTER — Other Ambulatory Visit: Payer: Self-pay

## 2018-11-21 DIAGNOSIS — Z1231 Encounter for screening mammogram for malignant neoplasm of breast: Secondary | ICD-10-CM

## 2018-12-08 DIAGNOSIS — H26492 Other secondary cataract, left eye: Secondary | ICD-10-CM | POA: Diagnosis not present

## 2018-12-08 DIAGNOSIS — H0102A Squamous blepharitis right eye, upper and lower eyelids: Secondary | ICD-10-CM | POA: Diagnosis not present

## 2018-12-08 DIAGNOSIS — H35371 Puckering of macula, right eye: Secondary | ICD-10-CM | POA: Diagnosis not present

## 2018-12-08 DIAGNOSIS — L719 Rosacea, unspecified: Secondary | ICD-10-CM | POA: Diagnosis not present

## 2019-04-29 ENCOUNTER — Encounter: Payer: No Typology Code available for payment source | Admitting: Gastroenterology

## 2019-05-01 ENCOUNTER — Ambulatory Visit (AMBULATORY_SURGERY_CENTER): Payer: Self-pay

## 2019-05-01 ENCOUNTER — Other Ambulatory Visit: Payer: Self-pay

## 2019-05-01 VITALS — Temp 97.4°F | Ht 65.0 in | Wt 164.3 lb

## 2019-05-01 DIAGNOSIS — Z86010 Personal history of colonic polyps: Secondary | ICD-10-CM

## 2019-05-01 DIAGNOSIS — Z01818 Encounter for other preprocedural examination: Secondary | ICD-10-CM

## 2019-05-01 DIAGNOSIS — Z800 Family history of malignant neoplasm of digestive organs: Secondary | ICD-10-CM

## 2019-05-01 MED ORDER — NA SULFATE-K SULFATE-MG SULF 17.5-3.13-1.6 GM/177ML PO SOLN: 1.0000 | Freq: Once | ORAL | 0 refills | Status: AC

## 2019-05-01 NOTE — Progress Notes (Signed)

## 2019-05-04 ENCOUNTER — Telehealth: Payer: Self-pay | Admitting: Gastroenterology

## 2019-05-04 NOTE — Telephone Encounter (Signed)
Suprep is $205 per pharmacy. BIN: [id number] PCN: [id number] used and the Suprep is now $92.19 per pharmacist. I then called the patient and notified her. I offered to switch prep and the patient declined. Patient will pay for the suprep. Per cost co they do have Golytely available.

## 2019-05-08 ENCOUNTER — Encounter: Payer: Self-pay | Admitting: Gastroenterology

## 2019-05-12 ENCOUNTER — Other Ambulatory Visit: Payer: Self-pay | Admitting: Gastroenterology

## 2019-05-12 ENCOUNTER — Ambulatory Visit (INDEPENDENT_AMBULATORY_CARE_PROVIDER_SITE_OTHER): Payer: BC Managed Care – PPO

## 2019-05-12 DIAGNOSIS — Z1159 Encounter for screening for other viral diseases: Secondary | ICD-10-CM

## 2019-05-13 LAB — SARS CORONAVIRUS 2 (TAT 6-24 HRS): SARS Coronavirus 2: NEGATIVE

## 2019-05-15 ENCOUNTER — Encounter: Payer: Self-pay | Admitting: Gastroenterology

## 2019-05-15 ENCOUNTER — Other Ambulatory Visit: Payer: Self-pay

## 2019-05-15 ENCOUNTER — Ambulatory Visit (AMBULATORY_SURGERY_CENTER): Payer: BC Managed Care – PPO | Admitting: Gastroenterology

## 2019-05-15 VITALS — BP 125/78 | HR 64 | Temp 97.5°F | Resp 18 | Ht 65.0 in | Wt 164.0 lb

## 2019-05-15 DIAGNOSIS — Z800 Family history of malignant neoplasm of digestive organs: Secondary | ICD-10-CM

## 2019-05-15 DIAGNOSIS — Z86010 Personal history of colonic polyps: Secondary | ICD-10-CM | POA: Diagnosis not present

## 2019-05-15 DIAGNOSIS — D125 Benign neoplasm of sigmoid colon: Secondary | ICD-10-CM

## 2019-05-15 DIAGNOSIS — Z1211 Encounter for screening for malignant neoplasm of colon: Secondary | ICD-10-CM | POA: Diagnosis not present

## 2019-05-15 DIAGNOSIS — D122 Benign neoplasm of ascending colon: Secondary | ICD-10-CM | POA: Diagnosis not present

## 2019-05-15 DIAGNOSIS — D124 Benign neoplasm of descending colon: Secondary | ICD-10-CM | POA: Diagnosis not present

## 2019-05-15 MED ORDER — SODIUM CHLORIDE 0.9 % IV SOLN: 500.0000 mL | Freq: Once | INTRAVENOUS | Status: DC

## 2019-05-15 NOTE — Progress Notes (Signed)
To PACU, VSS. Report to [email or web address] 

## 2019-05-15 NOTE — Patient Instructions (Signed)
YOU HAD AN ENDOSCOPIC PROCEDURE TODAY AT THE [facility]:   Refer to the procedure report that was given to you for any specific questions about what was found during the examination.  If the procedure report does not answer your questions, please call your gastroenterologist to clarify.  If you requested that your care partner not be given the details of your procedure findings, then the procedure report has been included in a sealed envelope for you to review at your convenience later.  **Handout given on polyps**   YOU SHOULD EXPECT: Some feelings of bloating in the abdomen. Passage of more gas than usual.  Walking can help get rid of the air that was put into your GI tract during the procedure and reduce the bloating. If you had a lower endoscopy (such as a colonoscopy or flexible sigmoidoscopy) you may notice spotting of blood in your stool or on the toilet paper. If you underwent a bowel prep for your procedure, you may not have a normal bowel movement for a few days.  Please Note:  You might notice some irritation and congestion in your nose or some drainage.  This is from the oxygen used during your procedure.  There is no need for concern and it should clear up in a day or so.  SYMPTOMS TO REPORT IMMEDIATELY:   Following lower endoscopy (colonoscopy or flexible sigmoidoscopy):  Excessive amounts of blood in the stool  Significant tenderness or worsening of abdominal pains  Swelling of the abdomen that is new, acute  Fever of 100F or higher   For urgent or emergent issues, a gastroenterologist can be reached at any hour by calling [phone].   DIET:  We do recommend a small meal at first, but then you may proceed to your regular diet.  Drink plenty of fluids but you should avoid alcoholic beverages for 24 hours.  ACTIVITY:  You should plan to take it easy for the rest of today and you should NOT DRIVE or use heavy machinery until tomorrow (because of the sedation  medicines used during the test).    FOLLOW UP: Our staff will call the number listed on your records 48-72 hours following your procedure to check on you and address any questions or concerns that you may have regarding the information given to you following your procedure. If we do not reach you, we will leave a message.  We will attempt to reach you two times.  During this call, we will ask if you have developed any symptoms of COVID 19. If you develop any symptoms (ie: fever, flu-like symptoms, shortness of breath, cough etc.) before then, please call [phone].  If you test positive for Covid 19 in the 2 weeks post procedure, please call and report this information to us.    If any biopsies were taken you will be contacted by phone or by letter within the next 1-3 weeks.  Please call us at [phone] if you have not heard about the biopsies in 3 weeks.    SIGNATURES/CONFIDENTIALITY: You and/or your care partner have signed paperwork which will be entered into your electronic medical record.  These signatures attest to the fact that that the information above on your After Visit Summary has been reviewed and is understood.  Full responsibility of the confidentiality of this discharge information lies with you and/or your care-partner. 

## 2019-05-15 NOTE — Progress Notes (Signed)
Called to room to assist during endoscopic procedure.  Patient ID and intended procedure confirmed with present staff. Received instructions for my participation in the procedure from the performing physician.  

## 2019-05-15 NOTE — Op Note (Signed)
[facility] Patient Name: [name] [name] Procedure Date: 05/15/2019 8:54 AM MRN: [id number] Endoscopist: Milus Banister , MD Age: 59 Referring MD:  Date of Birth: 1961-03-17 Gender: Female Account #: [id number] Procedure:                Colonoscopy Indications:              High risk colon cancer surveillance: Personal                            history of colonic polyps; colonoscopy 2012 three                            polyps, two adenomas, one ssa; Family history of                            colon cancer in a first-degree relative (father                            diagnosed in his 10s). Colonoscopyp 04/2016 six                            subCM polyps, the majority were TAs Medicines:                Monitored Anesthesia Care Procedure:                Pre-Anesthesia Assessment:                           - Prior to the procedure, a History and Physical                            was performed, and patient medications and                            allergies were reviewed. The patient's tolerance of                            previous anesthesia was also reviewed. The risks                            and benefits of the procedure and the sedation                            options and risks were discussed with the patient.                            All questions were answered, and informed consent                            was obtained. Prior Anticoagulants: The patient has                            taken no previous anticoagulant or antiplatelet  agents. ASA Grade Assessment: II - A patient with                            mild systemic disease. After reviewing the risks                            and benefits, the patient was deemed in                            satisfactory condition to undergo the procedure.                           After obtaining informed consent, the colonoscope                            was passed under direct vision.  Throughout the                            procedure, the patient's blood pressure, pulse, and                            oxygen saturations were monitored continuously. The                            Colonoscope was introduced through the anus and                            advanced to the the cecum, identified by                            appendiceal orifice and ileocecal valve. The                            colonoscopy was performed without difficulty. The                            patient tolerated the procedure well. The quality                            of the bowel preparation was good. The ileocecal                            valve, appendiceal orifice, and rectum were                            photographed. Scope In: 8:59:39 AM Scope Out: 9:16:23 AM Scope Withdrawal Time: 0 hours 12 minutes 2 seconds  Total Procedure Duration: 0 hours 16 minutes 44 seconds  Findings:                 Four sessile polyps were found in the sigmoid                            colon, descending colon and ascending colon. The  polyps were 2 to 4 mm in size. These polyps were                            removed with a cold snare. Resection and retrieval                            were complete.                           The exam was otherwise without abnormality on                            direct and retroflexion views. Complications:            No immediate complications. Estimated blood loss:                            None. Estimated Blood Loss:     Estimated blood loss: none. Impression:               - Four 2 to 4 mm polyps in the sigmoid colon, in                            the descending colon and in the ascending colon,                            removed with a cold snare. Resected and retrieved.                           - The examination was otherwise normal on direct                            and retroflexion views. Recommendation:           - Patient has a  contact number available for                            emergencies. The signs and symptoms of potential                            delayed complications were discussed with the                            patient. Return to normal activities tomorrow.                            Written discharge instructions were provided to the                            patient.                           - Resume previous diet.                           - Continue present medications.                           -  Await pathology results. Milus Banister, MD 05/15/2019 9:19:41 AM This report has been signed electronically.

## 2019-05-15 NOTE — Progress Notes (Signed)
Vitals-CW Temp-JB  Patient late then had to use the restroom.  Pt's states no medical or surgical changes since previsit or office visit.

## 2019-05-19 ENCOUNTER — Telehealth: Payer: Self-pay | Admitting: *Deleted

## 2019-05-19 NOTE — Telephone Encounter (Signed)
  Follow up Call-  Call back number 05/15/2019  Post procedure Call Back phone  # [phone]  Permission to leave phone message Yes  Some recent data might be hidden     Patient questions:  Do you have a fever, pain , or abdominal swelling? No. Pain Score  0 *  Have you tolerated food without any problems? Yes.    Have you been able to return to your normal activities? Yes.    Do you have any questions about your discharge instructions: Diet   No. Medications  No. Follow up visit  No.  Do you have questions or concerns about your Care? No.  Actions: * If pain score is 4 or above: No action needed, pain <4.  1. Have you developed a fever since your procedure? no  2.   Have you had an respiratory symptoms (SOB or cough) since your procedure? no  3.   Have you tested positive for COVID 19 since your procedure no  4.   Have you had any family members/close contacts diagnosed with the COVID 19 since your procedure?  no   If yes to any of these questions please route to Joylene John, RN and Alphonsa Gin, Therapist, sports.

## 2019-05-20 ENCOUNTER — Encounter: Payer: Self-pay | Admitting: Gastroenterology

## 2019-05-20 DIAGNOSIS — E039 Hypothyroidism, unspecified: Secondary | ICD-10-CM | POA: Diagnosis not present

## 2019-05-20 DIAGNOSIS — J309 Allergic rhinitis, unspecified: Secondary | ICD-10-CM | POA: Diagnosis not present

## 2019-05-20 DIAGNOSIS — F329 Major depressive disorder, single episode, unspecified: Secondary | ICD-10-CM | POA: Diagnosis not present

## 2019-05-20 DIAGNOSIS — J45909 Unspecified asthma, uncomplicated: Secondary | ICD-10-CM | POA: Diagnosis not present

## 2019-07-14 DIAGNOSIS — R5382 Chronic fatigue, unspecified: Secondary | ICD-10-CM | POA: Diagnosis not present

## 2019-07-14 DIAGNOSIS — E039 Hypothyroidism, unspecified: Secondary | ICD-10-CM | POA: Diagnosis not present

## 2019-07-14 DIAGNOSIS — E063 Autoimmune thyroiditis: Secondary | ICD-10-CM | POA: Diagnosis not present

## 2019-08-28 DIAGNOSIS — L719 Rosacea, unspecified: Secondary | ICD-10-CM | POA: Diagnosis not present

## 2019-08-28 DIAGNOSIS — L821 Other seborrheic keratosis: Secondary | ICD-10-CM | POA: Diagnosis not present

## 2019-08-28 DIAGNOSIS — L409 Psoriasis, unspecified: Secondary | ICD-10-CM | POA: Diagnosis not present

## 2019-08-28 DIAGNOSIS — D225 Melanocytic nevi of trunk: Secondary | ICD-10-CM | POA: Diagnosis not present

## 2019-10-22 ENCOUNTER — Other Ambulatory Visit: Payer: Self-pay | Admitting: Obstetrics and Gynecology

## 2019-10-22 DIAGNOSIS — M8580 Other specified disorders of bone density and structure, unspecified site: Secondary | ICD-10-CM

## 2019-10-22 DIAGNOSIS — Z1231 Encounter for screening mammogram for malignant neoplasm of breast: Secondary | ICD-10-CM

## 2019-11-04 DIAGNOSIS — Z01419 Encounter for gynecological examination (general) (routine) without abnormal findings: Secondary | ICD-10-CM | POA: Diagnosis not present

## 2019-11-04 DIAGNOSIS — Z79890 Hormone replacement therapy (postmenopausal): Secondary | ICD-10-CM | POA: Diagnosis not present

## 2019-11-04 DIAGNOSIS — Z6827 Body mass index (BMI) 27.0-27.9, adult: Secondary | ICD-10-CM | POA: Diagnosis not present

## 2019-11-13 DIAGNOSIS — Z13820 Encounter for screening for osteoporosis: Secondary | ICD-10-CM | POA: Diagnosis not present

## 2019-11-17 DIAGNOSIS — Z1589 Genetic susceptibility to other disease: Secondary | ICD-10-CM | POA: Diagnosis not present

## 2019-11-17 DIAGNOSIS — E039 Hypothyroidism, unspecified: Secondary | ICD-10-CM | POA: Diagnosis not present

## 2019-11-17 DIAGNOSIS — E559 Vitamin D deficiency, unspecified: Secondary | ICD-10-CM | POA: Diagnosis not present

## 2019-11-17 DIAGNOSIS — E063 Autoimmune thyroiditis: Secondary | ICD-10-CM | POA: Diagnosis not present

## 2019-11-17 DIAGNOSIS — N951 Menopausal and female climacteric states: Secondary | ICD-10-CM | POA: Diagnosis not present

## 2019-11-17 DIAGNOSIS — F329 Major depressive disorder, single episode, unspecified: Secondary | ICD-10-CM | POA: Diagnosis not present

## 2019-11-24 ENCOUNTER — Other Ambulatory Visit: Payer: Self-pay

## 2019-11-24 ENCOUNTER — Ambulatory Visit
Admission: RE | Admit: 2019-11-24 | Discharge: 2019-11-24 | Disposition: A | Discharge status: Home / Self Care | Payer: BC Managed Care – PPO | Source: Ambulatory Visit | Attending: Obstetrics and Gynecology | Admitting: Obstetrics and Gynecology

## 2019-11-24 DIAGNOSIS — Z1231 Encounter for screening mammogram for malignant neoplasm of breast: Secondary | ICD-10-CM | POA: Diagnosis not present

## 2019-11-25 DIAGNOSIS — R5382 Chronic fatigue, unspecified: Secondary | ICD-10-CM | POA: Diagnosis not present

## 2019-11-25 DIAGNOSIS — N951 Menopausal and female climacteric states: Secondary | ICD-10-CM | POA: Diagnosis not present

## 2019-11-25 DIAGNOSIS — F329 Major depressive disorder, single episode, unspecified: Secondary | ICD-10-CM | POA: Diagnosis not present

## 2019-11-25 DIAGNOSIS — L409 Psoriasis, unspecified: Secondary | ICD-10-CM | POA: Diagnosis not present

## 2019-12-08 DIAGNOSIS — H0102B Squamous blepharitis left eye, upper and lower eyelids: Secondary | ICD-10-CM | POA: Diagnosis not present

## 2019-12-08 DIAGNOSIS — H43813 Vitreous degeneration, bilateral: Secondary | ICD-10-CM | POA: Diagnosis not present

## 2019-12-08 DIAGNOSIS — H35371 Puckering of macula, right eye: Secondary | ICD-10-CM | POA: Diagnosis not present

## 2019-12-08 DIAGNOSIS — H0102A Squamous blepharitis right eye, upper and lower eyelids: Secondary | ICD-10-CM | POA: Diagnosis not present

## 2020-02-25 DIAGNOSIS — R3915 Urgency of urination: Secondary | ICD-10-CM | POA: Diagnosis not present

## 2020-02-25 DIAGNOSIS — N3946 Mixed incontinence: Secondary | ICD-10-CM | POA: Diagnosis not present

## 2020-03-15 DIAGNOSIS — R3915 Urgency of urination: Secondary | ICD-10-CM | POA: Diagnosis not present

## 2020-03-15 DIAGNOSIS — R531 Weakness: Secondary | ICD-10-CM | POA: Diagnosis not present

## 2020-03-15 DIAGNOSIS — N3946 Mixed incontinence: Secondary | ICD-10-CM | POA: Diagnosis not present

## 2020-03-29 DIAGNOSIS — E039 Hypothyroidism, unspecified: Secondary | ICD-10-CM | POA: Diagnosis not present

## 2020-03-29 DIAGNOSIS — Z7185 Encounter for immunization safety counseling: Secondary | ICD-10-CM | POA: Diagnosis not present

## 2020-03-29 DIAGNOSIS — I10 Essential (primary) hypertension: Secondary | ICD-10-CM | POA: Diagnosis not present

## 2020-03-29 DIAGNOSIS — N3281 Overactive bladder: Secondary | ICD-10-CM | POA: Diagnosis not present

## 2020-03-30 DIAGNOSIS — N3946 Mixed incontinence: Secondary | ICD-10-CM | POA: Diagnosis not present

## 2020-03-30 DIAGNOSIS — R3915 Urgency of urination: Secondary | ICD-10-CM | POA: Diagnosis not present

## 2020-03-30 DIAGNOSIS — R531 Weakness: Secondary | ICD-10-CM | POA: Diagnosis not present

## 2020-04-04 DIAGNOSIS — Z23 Encounter for immunization: Secondary | ICD-10-CM | POA: Diagnosis not present

## 2020-05-11 DIAGNOSIS — Z79890 Hormone replacement therapy (postmenopausal): Secondary | ICD-10-CM | POA: Diagnosis not present

## 2020-05-11 DIAGNOSIS — F411 Generalized anxiety disorder: Secondary | ICD-10-CM | POA: Diagnosis not present

## 2020-05-11 DIAGNOSIS — M8580 Other specified disorders of bone density and structure, unspecified site: Secondary | ICD-10-CM | POA: Diagnosis not present

## 2020-05-11 DIAGNOSIS — I10 Essential (primary) hypertension: Secondary | ICD-10-CM | POA: Diagnosis not present

## 2020-05-27 DIAGNOSIS — F32A Depression, unspecified: Secondary | ICD-10-CM | POA: Diagnosis not present

## 2020-05-27 DIAGNOSIS — N951 Menopausal and female climacteric states: Secondary | ICD-10-CM | POA: Diagnosis not present

## 2020-05-27 DIAGNOSIS — L409 Psoriasis, unspecified: Secondary | ICD-10-CM | POA: Diagnosis not present

## 2020-05-27 DIAGNOSIS — R5382 Chronic fatigue, unspecified: Secondary | ICD-10-CM | POA: Diagnosis not present

## 2020-11-07 ENCOUNTER — Other Ambulatory Visit: Payer: Self-pay | Admitting: Obstetrics and Gynecology

## 2020-11-07 DIAGNOSIS — Z1231 Encounter for screening mammogram for malignant neoplasm of breast: Secondary | ICD-10-CM

## 2020-12-27 ENCOUNTER — Other Ambulatory Visit: Payer: Self-pay

## 2020-12-27 ENCOUNTER — Ambulatory Visit
Admission: RE | Admit: 2020-12-27 | Discharge: 2020-12-27 | Disposition: A | Discharge status: Home / Self Care | Payer: BC Managed Care – PPO | Source: Ambulatory Visit | Attending: Obstetrics and Gynecology | Admitting: Obstetrics and Gynecology

## 2020-12-27 DIAGNOSIS — Z1231 Encounter for screening mammogram for malignant neoplasm of breast: Secondary | ICD-10-CM

## 2021-11-29 ENCOUNTER — Other Ambulatory Visit: Payer: Self-pay | Admitting: Obstetrics and Gynecology

## 2021-11-29 DIAGNOSIS — Z1231 Encounter for screening mammogram for malignant neoplasm of breast: Secondary | ICD-10-CM

## 2021-12-29 ENCOUNTER — Ambulatory Visit
Admission: RE | Admit: 2021-12-29 | Discharge: 2021-12-29 | Disposition: A | Discharge status: Home / Self Care | Payer: BC Managed Care – PPO | Source: Ambulatory Visit | Attending: Obstetrics and Gynecology | Admitting: Obstetrics and Gynecology

## 2021-12-29 DIAGNOSIS — Z1231 Encounter for screening mammogram for malignant neoplasm of breast: Secondary | ICD-10-CM

## 2022-07-13 ENCOUNTER — Encounter: Payer: Self-pay | Admitting: Gastroenterology

## 2022-08-17 ENCOUNTER — Encounter: Payer: Self-pay | Admitting: Gastroenterology

## 2022-10-15 DIAGNOSIS — M8580 Other specified disorders of bone density and structure, unspecified site: Secondary | ICD-10-CM | POA: Insufficient documentation

## 2022-10-26 ENCOUNTER — Ambulatory Visit (AMBULATORY_SURGERY_CENTER): Payer: BC Managed Care – PPO | Admitting: *Deleted

## 2022-10-26 VITALS — Ht 64.0 in | Wt 160.0 lb

## 2022-10-26 DIAGNOSIS — Z86010 Personal history of colonic polyps: Secondary | ICD-10-CM

## 2022-10-26 MED ORDER — NA SULFATE-K SULFATE-MG SULF 17.5-3.13-1.6 GM/177ML PO SOLN
1.0000 | Freq: Once | ORAL | 0 refills | Status: AC
Start: 2022-10-26 — End: 2022-10-26

## 2022-10-26 NOTE — Progress Notes (Signed)
Pt's name and DOB verified at the beginning of the pre-visit.  Pt denies any difficulty with ambulating,sitting, laying down or rolling side to side Gave both [facility] main # and MD on call # prior to instructions.  No egg or soy allergy known to patient  No issues known to pt with past sedation with any surgeries or procedures Pt denies having issues being intubated Pt has no issues moving head neck or swallowing No FH of Malignant Hyperthermia Pt is not on diet pills Pt is not on home 02  Pt is not on blood thinners  Pt denies issues with constipation  Pt is not on dialysis Pt denise any abnormal heart rhythms  Pt denies any upcoming cardiac testing Pt encouraged to use to use Singlecare or Goodrx to reduce cost  Patient's chart reviewed by Cathlyn Parsons CNRA prior to pre-visit and patient appropriate for the [facility].  Pre-visit completed and red dot placed by patient's name on their procedure day (on provider's schedule).  . Visit by phone Pt states weight is 160 lb Instructed pt why it is important to and  to call if they have any changes in health or new medications. Directed them to the # given and on instructions.   Pt states they will.  Instructions reviewed with pt and pt states understanding. Instructed to review again prior to procedure. Pt states they will.  Instructions sent by mail with coupon and by my chart

## 2022-11-06 ENCOUNTER — Encounter: Payer: Self-pay | Admitting: Gastroenterology

## 2022-11-14 ENCOUNTER — Telehealth: Payer: Self-pay | Admitting: Gastroenterology

## 2022-11-14 NOTE — Telephone Encounter (Signed)
Inbound call from patient requesting to speak about upcoming procedure . Please advise.   Thank you

## 2022-11-14 NOTE — Telephone Encounter (Signed)
Pt concerned about doing all the prep work for this coming Friday procedure and the [facility] coming. She did not want to get told that we were not able due her procedure due to this. RN informed her that even though there is always a possibility that could happen due to not knowing  but that we generally come regardless of weather and  reminded her that  [place]. Pt stated she just wanted to make sure. No other questions at this time.

## 2022-11-16 ENCOUNTER — Encounter: Payer: Self-pay | Admitting: Gastroenterology

## 2022-11-16 ENCOUNTER — Ambulatory Visit (AMBULATORY_SURGERY_CENTER): Payer: BC Managed Care – PPO | Admitting: Gastroenterology

## 2022-11-16 ENCOUNTER — Other Ambulatory Visit: Payer: Self-pay | Admitting: Gastroenterology

## 2022-11-16 ENCOUNTER — Telehealth: Payer: Self-pay | Admitting: Gastroenterology

## 2022-11-16 VITALS — BP 128/78 | HR 66 | Temp 96.9°F | Resp 13 | Ht 64.0 in | Wt 160.0 lb

## 2022-11-16 DIAGNOSIS — D123 Benign neoplasm of transverse colon: Secondary | ICD-10-CM

## 2022-11-16 DIAGNOSIS — D127 Benign neoplasm of rectosigmoid junction: Secondary | ICD-10-CM | POA: Diagnosis not present

## 2022-11-16 DIAGNOSIS — Z86010 Personal history of colonic polyps: Secondary | ICD-10-CM | POA: Diagnosis not present

## 2022-11-16 DIAGNOSIS — D122 Benign neoplasm of ascending colon: Secondary | ICD-10-CM

## 2022-11-16 DIAGNOSIS — D124 Benign neoplasm of descending colon: Secondary | ICD-10-CM

## 2022-11-16 DIAGNOSIS — Z09 Encounter for follow-up examination after completed treatment for conditions other than malignant neoplasm: Secondary | ICD-10-CM

## 2022-11-16 MED ORDER — SODIUM CHLORIDE 0.9 % IV SOLN
500.0000 mL | INTRAVENOUS | Status: DC
Start: 2022-11-16 — End: 2022-11-16

## 2022-11-16 MED ORDER — HYDROCORTISONE ACETATE 25 MG RE SUPP: 25.0000 mg | Freq: Every evening | RECTAL | 1 refills | Status: DC

## 2022-11-16 NOTE — Progress Notes (Unsigned)
Sedate, gd SR, tolerated procedure well, VSS, report to RN 

## 2022-11-16 NOTE — Progress Notes (Unsigned)
Pt's states no medical or surgical changes since previsit or office visit. 

## 2022-11-16 NOTE — Telephone Encounter (Signed)
Called [facility] and was advised that they were waiting on a prior authorization for Anusol suppositories.   Phoned pt to let her know.  Instructed her to use Preparation H until she can have the anusol suppositories filled.

## 2022-11-16 NOTE — Progress Notes (Signed)
Called to room to assist during endoscopic procedure.  Patient ID and intended procedure confirmed with present staff. Received instructions for my participation in the procedure from the performing physician.  

## 2022-11-16 NOTE — Telephone Encounter (Signed)
Inbound call from patient stating she was advised prescriptions were to be sent in after her colonoscopy today. Patient stated pharmacy need more information before they can fill prescriptions. Patient would like to have prescriptions before the weekend. Please advise, thank you.

## 2022-11-16 NOTE — Progress Notes (Unsigned)
GASTROENTEROLOGY PROCEDURE H&P NOTE   Primary Care Physician: Roderick Pee, MD (Inactive)  HPI: [name] [name] is a 62 y.o. female who presents for Colonoscopy for surveillance of prior polyps and FHx Colon Cancer.  Past Medical History:  Diagnosis Date   Allergic rhinitis    Allergy    Anemia    borderline   Asthma    Cataract    Cervical disc disease    Colon polyps    Fibrocystic breast changes    GERD (gastroesophageal reflux disease)    Osteopenia    Thyroid disease    hypothyroid   Past Surgical History:  Procedure Laterality Date   BLADDER SUSPENSION  2008   CATARACT EXTRACTION, BILATERAL     CESAREAN SECTION  2003   COLONOSCOPY     MANDIBLE SURGERY     POLYPECTOMY     TUBAL LIGATION  2008   Current Outpatient Medications  Medication Sig Dispense Refill   cetirizine (ZYRTEC) 10 MG tablet Take 10 mg by mouth daily.     Cholecalciferol (VITAMIN D PO) Take by mouth daily.     Coenzyme Q10 (COQ10) 30 MG CAPS CoQ10     doxycycline (PERIOSTAT) 20 MG tablet Take 20 mg by mouth 2 (two) times daily.  10   estradiol (CLIMARA - DOSED IN MG/24 HR) 0.075 mg/24hr patch Place 0.075 mg onto the skin 2 (two) times a week.      levothyroxine (SYNTHROID) 25 MCG tablet Take 25 mcg by mouth daily before breakfast. Takes at bedtime     Magnesium 125 MG CAPS Taurtrate and Glyconate     montelukast (SINGULAIR) 10 MG tablet Take 1 tablet (10 mg total) by mouth at bedtime. 100 tablet 4   Nutritional Supplements (DHEA PO) Take 5 mg by mouth. 3 times per week     progesterone (PROMETRIUM) 100 MG capsule Take total of 4 tablets QD  3   Thyroid (NATURE-THROID) 16.25 MG TABS Takes in AM     tolterodine (DETROL LA) 4 MG 24 hr capsule Take 1 capsule every day by oral route.     albuterol (PROVENTIL HFA;VENTOLIN HFA) 108 (90 BASE) MCG/ACT inhaler Inhale 2 puffs into the lungs every 4 (four) hours as needed. 18 g 2   aspirin 81 MG tablet Take 81 mg by mouth daily.      Azelastine-Fluticasone (DYMISTA) 137-50 MCG/ACT SUSP Place 1 spray into the nose 2 (two) times daily. 3 Bottle 4   CLARITIN-D 12 HOUR 5-120 MG per tablet TAKE 1 TABLET BY MOUTH TWICE A DAY 60 tablet 5   clobetasol cream (TEMOVATE) 0.05 % clobetasol 0.05 % topical cream  APPLY A THIN LAYER TO THE AFFECTED AREA(S) BY TOPICAL ROUTE 2 TIMES PER DAY (Patient not taking: Reported on 10/26/2022)     desonide (DESOWEN) 0.05 % cream Apply topically 2 (two) times daily.     Fluocinolone Acetonide (DERMOTIC) 0.01 % OIL Place 2 drops into both ears as needed.     Fluticasone-Salmeterol (ADVAIR DISKUS) 100-50 MCG/DOSE AEPB INHALE 1 PUFF INTO LUNGS TWICE A DAY (Patient not taking: Reported on 10/26/2022) 180 each 4   Multiple Vitamin (MULTIVITAMIN) tablet Take 1 tablet by mouth daily. (Patient not taking: Reported on 10/26/2022)     Omega-3 Fatty Acids (FISH OIL) 300 MG CAPS Take 300 mg by mouth daily.     oxymetazoline (AFRIN NASAL SPRAY) 0.05 % nasal spray Place 2 sprays into the nose 2 (two) times daily.  tretinoin (RETIN-A) 0.025 % cream Apply topically at bedtime. (Patient not taking: Reported on 10/26/2022)     Current Facility-Administered Medications  Medication Dose Route Frequency Provider Last Rate Last Admin   0.9 %  sodium chloride infusion  500 mL Intravenous Continuous Mansouraty, Netty Starring., MD        Current Outpatient Medications:    cetirizine (ZYRTEC) 10 MG tablet, Take 10 mg by mouth daily., Disp: , Rfl:    Cholecalciferol (VITAMIN D PO), Take by mouth daily., Disp: , Rfl:    Coenzyme Q10 (COQ10) 30 MG CAPS, CoQ10, Disp: , Rfl:    doxycycline (PERIOSTAT) 20 MG tablet, Take 20 mg by mouth 2 (two) times daily., Disp: , Rfl: 10   estradiol (CLIMARA - DOSED IN MG/24 HR) 0.075 mg/24hr patch, Place 0.075 mg onto the skin 2 (two) times a week. , Disp: , Rfl:    levothyroxine (SYNTHROID) 25 MCG tablet, Take 25 mcg by mouth daily before breakfast. Takes at bedtime, Disp: , Rfl:    Magnesium  125 MG CAPS, Taurtrate and Glyconate, Disp: , Rfl:    montelukast (SINGULAIR) 10 MG tablet, Take 1 tablet (10 mg total) by mouth at bedtime., Disp: 100 tablet, Rfl: 4   Nutritional Supplements (DHEA PO), Take 5 mg by mouth. 3 times per week, Disp: , Rfl:    progesterone (PROMETRIUM) 100 MG capsule, Take total of 4 tablets QD, Disp: , Rfl: 3   Thyroid (NATURE-THROID) 16.25 MG TABS, Takes in AM, Disp: , Rfl:    tolterodine (DETROL LA) 4 MG 24 hr capsule, Take 1 capsule every day by oral route., Disp: , Rfl:    albuterol (PROVENTIL HFA;VENTOLIN HFA) 108 (90 BASE) MCG/ACT inhaler, Inhale 2 puffs into the lungs every 4 (four) hours as needed., Disp: 18 g, Rfl: 2   aspirin 81 MG tablet, Take 81 mg by mouth daily., Disp: , Rfl:    Azelastine-Fluticasone (DYMISTA) 137-50 MCG/ACT SUSP, Place 1 spray into the nose 2 (two) times daily., Disp: 3 Bottle, Rfl: 4   CLARITIN-D 12 HOUR 5-120 MG per tablet, TAKE 1 TABLET BY MOUTH TWICE A DAY, Disp: 60 tablet, Rfl: 5   clobetasol cream (TEMOVATE) 0.05 %, clobetasol 0.05 % topical cream  APPLY A THIN LAYER TO THE AFFECTED AREA(S) BY TOPICAL ROUTE 2 TIMES PER DAY (Patient not taking: Reported on 10/26/2022), Disp: , Rfl:    desonide (DESOWEN) 0.05 % cream, Apply topically 2 (two) times daily., Disp: , Rfl:    Fluocinolone Acetonide (DERMOTIC) 0.01 % OIL, Place 2 drops into both ears as needed., Disp: , Rfl:    Fluticasone-Salmeterol (ADVAIR DISKUS) 100-50 MCG/DOSE AEPB, INHALE 1 PUFF INTO LUNGS TWICE A DAY (Patient not taking: Reported on 10/26/2022), Disp: 180 each, Rfl: 4   Multiple Vitamin (MULTIVITAMIN) tablet, Take 1 tablet by mouth daily. (Patient not taking: Reported on 10/26/2022), Disp: , Rfl:    Omega-3 Fatty Acids (FISH OIL) 300 MG CAPS, Take 300 mg by mouth daily., Disp: , Rfl:    oxymetazoline (AFRIN NASAL SPRAY) 0.05 % nasal spray, Place 2 sprays into the nose 2 (two) times daily.  , Disp: , Rfl:    tretinoin (RETIN-A) 0.025 % cream, Apply topically at  bedtime. (Patient not taking: Reported on 10/26/2022), Disp: , Rfl:   Current Facility-Administered Medications:    0.9 %  sodium chloride infusion, 500 mL, Intravenous, Continuous, Mansouraty, Netty Starring., MD Allergies  Allergen Reactions   Codeine Other (See Comments)    GI Upset  Mirabegron Hives   Moxifloxacin Hives and Other (See Comments)    avenox   Family History  Problem Relation Age of Onset   Colon cancer Father    Colon polyps Father    Breast cancer Paternal Aunt        in 55's   Breast cancer Paternal Aunt        in 61's   Breast cancer Cousin        in 30's   Asthma Other    Allergies Other    Esophageal cancer Neg Hx    Rectal cancer Neg Hx    Stomach cancer Neg Hx    Social History   Socioeconomic History   Marital status: Married    Spouse name: Not on file   Number of children: Not on file   Years of education: Not on file   Highest education level: Not on file  Occupational History   Occupation: laid off    Comment: radiology tech  Tobacco Use   Smoking status: Former    Current packs/day: 0.00    Types: Cigarettes    Quit date: 04/1978    Years since quitting: 44.6   Smokeless tobacco: Never  Vaping Use   Vaping status: Never Used  Substance and Sexual Activity   Alcohol use: Yes    Alcohol/week: 2.0 standard drinks of alcohol    Types: 1 Glasses of wine, 1 Standard drinks or equivalent per week   Drug use: No   Sexual activity: Not on file  Other Topics Concern   Not on file  Social History Narrative   Not on file   Social Determinants of Health   Financial Resource Strain: Not on file  Food Insecurity: Not on file  Transportation Needs: Not on file  Physical Activity: Not on file  Stress: Not on file  Social Connections: Not on file  Intimate Partner Violence: Not on file    Physical Exam: Today's Vitals   11/16/22 0718 11/16/22 0749  BP: 138/66   Pulse: 64   Resp:  17  Temp: (!) 96.9 F (36.1 C)   TempSrc: Temporal    SpO2: 96% (!) 83%  Weight: 160 lb (72.6 kg)   Height: 5\' 4"  (1.626 m)    Body mass index is 27.46 kg/m. GEN: NAD EYE: Sclerae anicteric ENT: MMM CV: Non-tachycardic GI: Soft, NT/ND NEURO:  Alert & Oriented x 3  Lab Results: No results for input(s): "WBC", "HGB", "HCT", "PLT" in the last 72 hours. BMET No results for input(s): "NA", "K", "CL", "CO2", "GLUCOSE", "BUN", "CREATININE", "CALCIUM" in the last 72 hours. LFT No results for input(s): "PROT", "ALBUMIN", "AST", "ALT", "ALKPHOS", "BILITOT", "BILIDIR", "IBILI" in the last 72 hours. PT/INR No results for input(s): "LABPROT", "INR" in the last 72 hours.   Impression / Plan: This is a 62 y.o.female presents for Colonoscopy for surveillance of prior polyps and FHx Colon Cancer.  The risks and benefits of endoscopic evaluation/treatment were discussed with the patient and/or family; these include but are not limited to the risk of perforation, infection, bleeding, missed lesions, lack of diagnosis, severe illness requiring hospitalization, as well as anesthesia and sedation related illnesses.  The patient's history has been reviewed, patient examined, no change in status, and deemed stable for procedure.  The patient and/or family is agreeable to proceed.    Corliss Parish, MD  Gastroenterology Advanced Endoscopy Office # [phone]

## 2022-11-16 NOTE — Patient Instructions (Addendum)
- High fiber diet.                           - Use FiberCon 1-2 tablets PO daily.                           - Continue present medications.                           - 5 polyps removed and sent to pathology.                           - Await pathology results.                           - Repeat colonoscopy in 3 years for surveillance.                           - Anusol suppositories will be discussed with                            patient's hemorrhoidal disease if she would like.                           - The findings and recommendations were discussed                            with the patient.                           - The findings and recommendations were discussed                            with the patient's family.  YOU HAD AN ENDOSCOPIC PROCEDURE TODAY AT THE [facility]:   Refer to the procedure report that was given to you for any specific questions about what was found during the examination.  If the procedure report does not answer your questions, please call your gastroenterologist to clarify.  If you requested that your care partner not be given the details of your procedure findings, then the procedure report has been included in a sealed envelope for you to review at your convenience later.  YOU SHOULD EXPECT: Some feelings of bloating in the abdomen. Passage of more gas than usual.  Walking can help get rid of the air that was put into your GI tract during the procedure and reduce the bloating. If you had a lower endoscopy (such as a colonoscopy or flexible sigmoidoscopy) you may notice spotting of blood in your stool or on the toilet paper. If you underwent a bowel prep for your procedure, you may not have a normal bowel movement for a few days.  Please Note:  You might notice some irritation and congestion in your nose or some drainage.  This is from the oxygen used during your procedure.  There is no need for concern and it should clear  up in a day or so.  SYMPTOMS TO REPORT IMMEDIATELY:  Following lower endoscopy (colonoscopy or flexible sigmoidoscopy):  Excessive amounts of blood in the stool  Significant tenderness or worsening of abdominal pains  Swelling of the abdomen that is new, acute  Fever of 100F or higher   For urgent or emergent issues, a gastroenterologist can be reached at any hour by calling [phone]. Do not use MyChart messaging for urgent concerns.    DIET:  We do recommend a small meal at first, but then you may proceed to your regular diet.  Drink plenty of fluids but you should avoid alcoholic beverages for 24 hours.  ACTIVITY:  You should plan to take it easy for the rest of today and you should NOT DRIVE or use heavy machinery until tomorrow (because of the sedation medicines used during the test).    FOLLOW UP: Our staff will call the number listed on your records the next business day following your procedure.  We will call around 7:15- 8:00 am to check on you and address any questions or concerns that you may have regarding the information given to you following your procedure. If we do not reach you, we will leave a message.     If any biopsies were taken you will be contacted by phone or by letter within the next 1-3 weeks.  Please call us at [phone] if you have not heard about the biopsies in 3 weeks.    SIGNATURES/CONFIDENTIALITY: You and/or your care partner have signed paperwork which will be entered into your electronic medical record.  These signatures attest to the fact that that the information above on your After Visit Summary has been reviewed and is understood.  Full responsibility of the confidentiality of this discharge information lies with you and/or your care-partner.

## 2022-11-16 NOTE — Op Note (Signed)
[facility] Patient Name: [name] [name] Procedure Date: 11/16/2022 7:13 AM MRN: [id number] Endoscopist: Corliss Parish , MD, [id number] Age: 62 Referring MD:  Date of Birth: 03/27/1961 Gender: Female Account #: [id number] Procedure:                Colonoscopy Indications:              Screening in patient at increased risk: Colorectal                            cancer in father before age 61, Surveillance:                            Personal history of adenomatous polyps on last                            colonoscopy 3 years ago Medicines:                Monitored Anesthesia Care Procedure:                Pre-Anesthesia Assessment:                           - Prior to the procedure, a History and Physical                            was performed, and patient medications and                            allergies were reviewed. The patient's tolerance of                            previous anesthesia was also reviewed. The risks                            and benefits of the procedure and the sedation                            options and risks were discussed with the patient.                            All questions were answered, and informed consent                            was obtained. Prior Anticoagulants: The patient has                            taken no anticoagulant or antiplatelet agents                            except for aspirin. ASA Grade Assessment: II - A                            patient with mild systemic disease. After reviewing  the risks and benefits, the patient was deemed in                            satisfactory condition to undergo the procedure.                           After obtaining informed consent, the colonoscope                            was passed under direct vision. Throughout the                            procedure, the patient's blood pressure, pulse, and                            oxygen saturations  were monitored continuously. The                            CF [id number] #[id number] was introduced through the anus                            and advanced to the the cecum, identified by                            appendiceal orifice and ileocecal valve. The                            colonoscopy was performed without difficulty. The                            patient tolerated the procedure. The quality of the                            bowel preparation was adequate. The ileocecal                            valve, appendiceal orifice, and rectum were                            photographed. Scope In: 7:58:13 AM Scope Out: 8:12:37 AM Scope Withdrawal Time: 0 hours 11 minutes 7 seconds  Total Procedure Duration: 0 hours 14 minutes 24 seconds  Findings:                 The digital rectal exam findings include                            hemorrhoids. Pertinent negatives include no                            palpable rectal lesions.                           The colon (entire examined portion) revealed  moderately excessive looping.                           A few small-mouthed diverticula were found in the                            recto-sigmoid colon and sigmoid colon.                           Five sessile polyps were found in the recto-sigmoid                            colon (1), descending colon (1), transverse colon                            (2), and ascending colon (1). The polyps were 3 to                            12 mm in size. These polyps were removed with a                            cold snare. Resection and retrieval were complete.                           Normal mucosa was found in the entire colon                            otherwise.                           Non-bleeding non-thrombosed external and internal                            hemorrhoids were found during retroflexion, during                            perianal exam and during digital  exam. The                            hemorrhoids were Grade III (internal hemorrhoids                            that prolapse but require manual reduction). Complications:            No immediate complications. Estimated Blood Loss:     Estimated blood loss was minimal. Impression:               - Hemorrhoids found on digital rectal exam.                           - There was significant looping of the colon.                           - Diverticulosis in the recto-sigmoid colon and in  the sigmoid colon.                           - Five 3 to 12 mm polyps at the recto-sigmoid                            colon, in the descending colon, in the transverse                            colon and in the ascending colon, removed with a                            cold snare. Resected and retrieved.                           - Normal mucosa in the entire examined colon                            otherwise.                           - Non-bleeding non-thrombosed external and internal                            hemorrhoids. Recommendation:           - The patient will be observed post-procedure,                            until all discharge criteria are met.                           - Discharge patient to home.                           - Patient has a contact number available for                            emergencies. The signs and symptoms of potential                            delayed complications were discussed with the                            patient. Return to normal activities tomorrow.                            Written discharge instructions were provided to the                            patient.                           - High fiber diet.                           - Use  FiberCon 1-2 tablets PO daily.                           - Continue present medications.                           - Await pathology results.                           - Repeat colonoscopy  in 3 years for surveillance.                           - Anusol suppositories will be discussed with                            patient's hemorrhoidal disease if she would like.                           - The findings and recommendations were discussed                            with the patient.                           - The findings and recommendations were discussed                            with the patient's family. Corliss Parish, MD 11/16/2022 8:22:55 AM

## 2022-11-20 ENCOUNTER — Telehealth: Payer: Self-pay

## 2022-11-20 NOTE — Telephone Encounter (Signed)
Post procedure follow up call, no answer 

## 2022-11-21 ENCOUNTER — Encounter: Payer: Self-pay | Admitting: Gastroenterology

## 2022-11-26 ENCOUNTER — Other Ambulatory Visit (HOSPITAL_COMMUNITY): Payer: Self-pay

## 2022-11-26 MED ORDER — HYDROCORTISONE ACETATE 25 MG RE SUPP
25.0000 mg | Freq: Every evening | RECTAL | 1 refills | Status: AC
Start: 2022-11-26 — End: ?

## 2022-11-26 NOTE — Telephone Encounter (Signed)
Informed patient of Dr. Lestine Mount over the counter recommendations to try or offered patient a Good rx to see if she can out of pocket for the medication. Patient would like to get the medication from [facility] to see if it is cheaper. Prescription sent to [facility].

## 2022-11-26 NOTE — Telephone Encounter (Signed)
Medications is not covered due to it not being FDA Approved, patient may use OTC products or pay out of pocket for medication.

## 2022-11-26 NOTE — Telephone Encounter (Signed)
I guess I am confused as how hydrocortisone Anusol suppositories are not FDA approved for hemorrhoid use when that is what we use them for on a regular basis. Can you find out what the issue is otherwise it can be sent in and she can use GoodRx to get a prescription? If this does not work, then Preparation H suppositories alternating with Calmol-4 suppositories can be used.

## 2022-12-20 ENCOUNTER — Other Ambulatory Visit: Payer: Self-pay | Admitting: Obstetrics and Gynecology

## 2022-12-20 DIAGNOSIS — Z1231 Encounter for screening mammogram for malignant neoplasm of breast: Secondary | ICD-10-CM

## 2022-12-31 ENCOUNTER — Other Ambulatory Visit: Payer: Self-pay

## 2022-12-31 ENCOUNTER — Emergency Department (HOSPITAL_BASED_OUTPATIENT_CLINIC_OR_DEPARTMENT_OTHER): Payer: BC Managed Care – PPO

## 2022-12-31 ENCOUNTER — Encounter (HOSPITAL_BASED_OUTPATIENT_CLINIC_OR_DEPARTMENT_OTHER): Payer: Self-pay | Admitting: *Deleted

## 2022-12-31 ENCOUNTER — Emergency Department (HOSPITAL_BASED_OUTPATIENT_CLINIC_OR_DEPARTMENT_OTHER)
Admission: EM | Admit: 2022-12-31 | Discharge: 2022-12-31 | Disposition: A | Discharge status: Home / Self Care | Payer: BC Managed Care – PPO | Attending: Emergency Medicine | Admitting: Emergency Medicine

## 2022-12-31 ENCOUNTER — Emergency Department (HOSPITAL_BASED_OUTPATIENT_CLINIC_OR_DEPARTMENT_OTHER): Payer: BC Managed Care – PPO | Admitting: Radiology

## 2022-12-31 ENCOUNTER — Other Ambulatory Visit (HOSPITAL_BASED_OUTPATIENT_CLINIC_OR_DEPARTMENT_OTHER): Payer: Self-pay

## 2022-12-31 DIAGNOSIS — R0989 Other specified symptoms and signs involving the circulatory and respiratory systems: Secondary | ICD-10-CM | POA: Diagnosis present

## 2022-12-31 DIAGNOSIS — Z87891 Personal history of nicotine dependence: Secondary | ICD-10-CM | POA: Insufficient documentation

## 2022-12-31 DIAGNOSIS — J45909 Unspecified asthma, uncomplicated: Secondary | ICD-10-CM | POA: Diagnosis not present

## 2022-12-31 DIAGNOSIS — R059 Cough, unspecified: Secondary | ICD-10-CM | POA: Insufficient documentation

## 2022-12-31 DIAGNOSIS — Z7982 Long term (current) use of aspirin: Secondary | ICD-10-CM | POA: Diagnosis not present

## 2022-12-31 DIAGNOSIS — Z7951 Long term (current) use of inhaled steroids: Secondary | ICD-10-CM | POA: Insufficient documentation

## 2022-12-31 DIAGNOSIS — R09A2 Foreign body sensation, throat: Secondary | ICD-10-CM

## 2022-12-31 LAB — CBC WITH DIFFERENTIAL/PLATELET
Abs Immature Granulocytes: 0.03 10*3/uL (ref 0.00–0.07)
Basophils Absolute: 0 10*3/uL (ref 0.0–0.1)
Basophils Relative: 0 %
Eosinophils Absolute: 0 10*3/uL (ref 0.0–0.5)
Eosinophils Relative: 0 %
HCT: 39.3 % (ref 36.0–46.0)
Hemoglobin: 13.4 g/dL (ref 12.0–15.0)
Immature Granulocytes: 0 %
Lymphocytes Relative: 11 %
Lymphs Abs: 1.1 10*3/uL (ref 0.7–4.0)
MCH: 29.9 pg (ref 26.0–34.0)
MCHC: 34.1 g/dL (ref 30.0–36.0)
MCV: 87.7 fL (ref 80.0–100.0)
Monocytes Absolute: 0.5 10*3/uL (ref 0.1–1.0)
Monocytes Relative: 5 %
Neutro Abs: 8.2 10*3/uL — ABNORMAL HIGH (ref 1.7–7.7)
Neutrophils Relative %: 84 %
Platelets: 291 10*3/uL (ref 150–400)
RBC: 4.48 MIL/uL (ref 3.87–5.11)
RDW: 12.5 % (ref 11.5–15.5)
WBC: 9.9 10*3/uL (ref 4.0–10.5)
nRBC: 0 % (ref 0.0–0.2)

## 2022-12-31 LAB — BASIC METABOLIC PANEL
Anion gap: 8 (ref 5–15)
BUN: 15 mg/dL (ref 6–20)
CO2: 26 mmol/L (ref 22–32)
Calcium: 9.9 mg/dL (ref 8.9–10.3)
Chloride: 103 mmol/L (ref 98–111)
Creatinine, Ser: 0.73 mg/dL (ref 0.44–1.00)
GFR, Estimated: >60 mL/min (ref >60)
Glucose, Bld: 127 mg/dL — ABNORMAL HIGH (ref 70–99)
Potassium: 3.6 mmol/L (ref 3.5–5.1)
Sodium: 137 mmol/L (ref 135–145)

## 2022-12-31 MED ORDER — AEROCHAMBER PLUS FLO-VU MISC
1.0000 | Freq: Once | Status: AC
  Administered 2022-12-31: 1
  Filled 2022-12-31: qty 1

## 2022-12-31 MED ORDER — IOHEXOL 300 MG/ML  SOLN
100.0000 mL | Freq: Once | INTRAMUSCULAR | Status: AC | PRN
  Administered 2022-12-31: 75 mL via INTRAVENOUS

## 2022-12-31 MED ORDER — PREDNISONE 10 MG PO TABS
40.0000 mg | ORAL_TABLET | Freq: Every day | ORAL | 0 refills | Status: AC
  Filled 2022-12-31: qty 20, 5d supply, fill #0

## 2022-12-31 MED ORDER — ALBUTEROL SULFATE HFA 108 (90 BASE) MCG/ACT IN AERS
2.0000 | INHALATION_SPRAY | RESPIRATORY_TRACT | Status: DC | PRN
  Administered 2022-12-31: 2 via RESPIRATORY_TRACT
  Filled 2022-12-31: qty 6.7

## 2022-12-31 NOTE — ED Notes (Signed)
Pt to XR by wheelchair at this time.

## 2022-12-31 NOTE — Discharge Instructions (Signed)
Take the prednisone as directed.  As we discussed the CT soft tissue neck was very reassuring.  Also would use the albuterol inhaler every 6 hours.  Follow-up with your primary care doctor if things are not improving over the next few days return for anything that is new or worse.  Also could use over-the-counter Delsym 12-hour cough liquid.

## 2022-12-31 NOTE — ED Provider Notes (Addendum)
[place] AT [facility] Provider Note   CSN: [id number] Arrival date & time: 12/31/22  4098     History  Chief Complaint  Patient presents with   Cough    [name] [name] is a 62 y.o. female.  Patient last evening was taking some supplemental medication she tends to take it all at once.  She immediately started coughing felt like something got stuck in the back of her throat.  She coughed for about 2 hours.  Also was coughing up a fair amount of mucus at times.  Did not feel like it was any stomach contents.  The pills never did come back up.  Patient also became very hoarse.  Patient still coughing.  Patient with mild headache.  But no other respiratory symptoms and patient felt completely fine prior to this.  Patient has a business trip planned to go to [place] tomorrow.  Patient has a history of asthma.  The patient does not feel like she is wheezing.  Lungs are clear here.  But patient was given a treatment.  Patient is very hoarse.  Patient is a former smoker.       Home Medications Prior to Admission medications   Medication Sig Start Date End Date Taking? Authorizing Provider  albuterol (PROVENTIL HFA;VENTOLIN HFA) 108 (90 BASE) MCG/ACT inhaler Inhale 2 puffs into the lungs every 4 (four) hours as needed. 01/01/12   Roderick Pee, MD  aspirin 81 MG tablet Take 81 mg by mouth daily.    [provider]  Azelastine-Fluticasone (DYMISTA) 137-50 MCG/ACT SUSP Place 1 spray into the nose 2 (two) times daily. 12/11/17   Roderick Pee, MD  cetirizine (ZYRTEC) 10 MG tablet Take 10 mg by mouth daily. 12/28/10   Jetty Duhamel D, MD  Cholecalciferol (VITAMIN D PO) Take by mouth daily.    [provider]  CLARITIN-D 12 HOUR 5-120 MG per tablet TAKE 1 TABLET BY MOUTH TWICE A DAY 11/04/12   Young, Clinton D, MD  clobetasol cream (TEMOVATE) 0.05 % clobetasol 0.05 % topical cream  APPLY A THIN LAYER TO THE AFFECTED AREA(S) BY TOPICAL ROUTE 2 TIMES  PER DAY Patient not taking: Reported on 10/26/2022    [provider]  Coenzyme Q10 (COQ10) 30 MG CAPS CoQ10    [provider]  desonide (DESOWEN) 0.05 % cream Apply topically 2 (two) times daily.    [provider]  doxycycline (PERIOSTAT) 20 MG tablet Take 20 mg by mouth 2 (two) times daily. 08/15/14   [provider]  estradiol (CLIMARA - DOSED IN MG/24 HR) 0.075 mg/24hr patch Place 0.075 mg onto the skin 2 (two) times a week.     [provider]  Fluocinolone Acetonide (DERMOTIC) 0.01 % OIL Place 2 drops into both ears as needed.    [provider]  Fluticasone-Salmeterol (ADVAIR DISKUS) 100-50 MCG/DOSE AEPB INHALE 1 PUFF INTO LUNGS TWICE A DAY Patient not taking: Reported on 10/26/2022 12/11/17   Roderick Pee, MD  hydrocortisone (ANUSOL-HC) 25 MG suppository Place 1 suppository (25 mg total) rectally at bedtime. Use nightly for 1 week then every other night 11/26/22   Mansouraty, Netty Starring., MD  levothyroxine (SYNTHROID) 25 MCG tablet Take 25 mcg by mouth daily before breakfast. Takes at bedtime    [provider]  Magnesium 125 MG CAPS Taurtrate and Glyconate    [provider]  montelukast (SINGULAIR) 10 MG tablet Take 1 tablet (10 mg total) by mouth at  bedtime. 12/11/17   Roderick Pee, MD  Multiple Vitamin (MULTIVITAMIN) tablet Take 1 tablet by mouth daily. Patient not taking: Reported on 10/26/2022    [provider]  Nutritional Supplements (DHEA PO) Take 5 mg by mouth. 3 times per week    [provider]  Omega-3 Fatty Acids (FISH OIL) 300 MG CAPS Take 300 mg by mouth daily.    [provider]  oxymetazoline (AFRIN NASAL SPRAY) 0.05 % nasal spray Place 2 sprays into the nose 2 (two) times daily.      [provider]  progesterone (PROMETRIUM) 100 MG capsule Take total of 4 tablets QD 03/29/16   [provider]  Thyroid (NATURE-THROID) 16.25 MG TABS Takes in AM    [provider]  tolterodine (DETROL LA) 4 MG 24 hr capsule Take 1 capsule every day by oral route. 08/22/22   [provider]  tretinoin (RETIN-A) 0.025 % cream Apply topically at bedtime. Patient not taking: Reported on 10/26/2022    [provider]      Allergies    Codeine, Mirabegron, and Moxifloxacin    Review of Systems   Review of Systems  Constitutional:  Negative for chills and fever.  HENT:  Positive for voice change. Negative for ear pain, sore throat and trouble swallowing.   Eyes:  Negative for pain and visual disturbance.  Respiratory:  Positive for cough. Negative for shortness of breath and wheezing.   Cardiovascular:  Negative for chest pain and palpitations.  Gastrointestinal:  Negative for abdominal pain and vomiting.  Genitourinary:  Negative for dysuria and hematuria.  Musculoskeletal:  Negative for arthralgias and back pain.  Skin:  Negative for color change and rash.  Neurological:  Negative for seizures and syncope.  All other systems reviewed and are negative.   Physical Exam Updated Vital Signs BP (!) 167/94 (BP Location: Right Arm)   Pulse 90   Temp 98.3 F (36.8 C) (Oral)   Resp 20   Ht 1.651 m (5\' 5" )   Wt 72.6 kg   LMP 02/16/2012   SpO2 98%   BMI 26.63 kg/m  Physical Exam Vitals and nursing note reviewed.  Constitutional:      General: She is not in acute distress.    Appearance: Normal appearance. She is well-developed.  HENT:     Head: Normocephalic and atraumatic.     Mouth/Throat:     Mouth: Mucous membranes are moist.     Pharynx: Oropharynx is clear. No oropharyngeal exudate or posterior oropharyngeal erythema.     Comments: Uvula is midline. Eyes:     Extraocular Movements: Extraocular movements intact.     Conjunctiva/sclera: Conjunctivae normal.     Pupils: Pupils are equal, round, and reactive to light.  Cardiovascular:     Rate and Rhythm: Normal rate and regular rhythm.     Heart sounds: No murmur  heard. Pulmonary:     Effort: Pulmonary effort is normal. No respiratory distress.     Breath sounds: Normal breath sounds. No wheezing, rhonchi or rales.  Abdominal:     Palpations: Abdomen is soft.     Tenderness: There is no abdominal tenderness.  Musculoskeletal:        General: No swelling.     Cervical back: Normal range of motion and neck supple.  Skin:    General: Skin is warm and dry.     Capillary Refill: Capillary refill takes less than 2 seconds.  Neurological:     General:  No focal deficit present.     Mental Status: She is alert and oriented to person, place, and time.  Psychiatric:        Mood and Affect: Mood normal.     ED Results / Procedures / Treatments   Labs (all labs ordered are listed, but only abnormal results are displayed) Labs Reviewed  CBC WITH DIFFERENTIAL/PLATELET  BASIC METABOLIC PANEL    EKG None  Radiology DG Chest 2 View  Result Date: 12/31/2022 CLINICAL DATA:  Cough. Question aspiration of medication last night. EXAM: CHEST - 2 VIEW COMPARISON:  03/23/2004 FINDINGS: Heart size is normal. Mediastinal shadows are normal. There is central bronchial thickening but no infiltrate, collapse or effusion. No sign of radiopaque foreign object. No significant bone finding. IMPRESSION: Possible bronchitis or asthma. No consolidation, collapse or radiopaque foreign object. Electronically Signed   By: Paulina Fusi M.D.   On: 12/31/2022 07:03    Procedures Procedures    Medications Ordered in ED Medications  albuterol (VENTOLIN HFA) 108 (90 Base) MCG/ACT inhaler 2 puff (2 puffs Inhalation Given 12/31/22 0706)  aerochamber plus with mask device 1 each (1 each Other Given 12/31/22 0706)    ED Course/ Medical Decision Making/ A&P                                 Medical Decision Making Amount and/or Complexity of Data Reviewed Labs: ordered. Radiology: ordered.  Risk Prescription drug management.   Chest x-ray is clear.  But patient with a  very hoarse kind of cough.  Voice is very hoarse.  Lungs are clear no wheezing.  Examination of the pharynx without any acute abnormalities.  Suspect pill may have gotten lodged and then may be dissolved.  Causing irritation.  Long discussion with patient will do soft tissue neck just to see how much swelling there is.  Patient may be treated with a course of steroids.  If symptoms do not resolve may need to follow-up with ear nose and throat or either pulmonary medicine.   Oxygen saturations in the upper 90s.  So that is very reassuring.   Patient CBC normal.  Basic metabolic panel normal.  Patient CT soft tissue neck negative no significant swelling.  This is all very reassuring.  Will treat with course of prednisone for 5 days and will have patient use albuterol inhaler every 6 hours.   Final Clinical Impression(s) / ED Diagnoses Final diagnoses:  Foreign body sensation in throat    Rx / DC Orders ED Discharge Orders     None         Vanetta Mulders, MD 12/31/22 0740    Vanetta Mulders, MD 12/31/22 0740    Vanetta Mulders, MD 12/31/22 [phone]

## 2022-12-31 NOTE — ED Notes (Signed)
To x-ray

## 2022-12-31 NOTE — ED Notes (Signed)
Discharge paperwork given and verbally understood. 
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
## 2022-12-31 NOTE — ED Notes (Signed)
Patient transported to CT 

## 2023-01-15 ENCOUNTER — Ambulatory Visit
Admission: RE | Admit: 2023-01-15 | Discharge: 2023-01-15 | Disposition: A | Discharge status: Home / Self Care | Payer: BC Managed Care – PPO | Source: Ambulatory Visit | Attending: Obstetrics and Gynecology | Admitting: Obstetrics and Gynecology

## 2023-01-15 DIAGNOSIS — Z1231 Encounter for screening mammogram for malignant neoplasm of breast: Secondary | ICD-10-CM

## 2023-02-25 ENCOUNTER — Ambulatory Visit: Payer: BC Managed Care – PPO | Admitting: Podiatry

## 2023-03-11 ENCOUNTER — Ambulatory Visit (INDEPENDENT_AMBULATORY_CARE_PROVIDER_SITE_OTHER): Payer: BC Managed Care – PPO | Admitting: Podiatry

## 2023-03-11 ENCOUNTER — Ambulatory Visit (INDEPENDENT_AMBULATORY_CARE_PROVIDER_SITE_OTHER): Payer: BC Managed Care – PPO

## 2023-03-11 ENCOUNTER — Encounter: Payer: Self-pay | Admitting: Podiatry

## 2023-03-11 DIAGNOSIS — S90852A Superficial foreign body, left foot, initial encounter: Secondary | ICD-10-CM | POA: Diagnosis not present

## 2023-03-11 DIAGNOSIS — B070 Plantar wart: Secondary | ICD-10-CM

## 2023-03-11 DIAGNOSIS — M779 Enthesopathy, unspecified: Secondary | ICD-10-CM | POA: Diagnosis not present

## 2023-03-11 NOTE — Patient Instructions (Signed)
VISIT SUMMARY:  You came in today because of a painful lesion on the bottom of your left foot, which started after you twisted your ankle. The lesion has been causing you discomfort, especially when walking, and you are concerned about it affecting your upcoming trip to [place].  YOUR PLAN:  -PLANTAR WART: A plantar wart is a small growth on the bottom of the foot caused by the human papillomavirus (HPV). It can be painful, especially when walking. Today, the lesion was debrided and treated with salicylic acid ointment. You should apply 40-50% strength salicylic acid to the lesion every night at home until it resolves, which may take about a month. If the lesion does not improve, we may consider a stronger acid treatment called cantharone.  INSTRUCTIONS:  Please apply the salicylic acid ointment nightly as directed. If the lesion does not improve or if you have any concerns, please schedule a follow-up appointment.

## 2023-03-11 NOTE — Progress Notes (Signed)
  Subjective:  Patient ID: [name] [name], female    DOB: Nov 16, 1960,  MRN: [id number]  Chief Complaint  Patient presents with   Foot Pain    "Back in May, I injured my foot.  This place came up on the bottom of my foot where I injured it.  I dug it out.  It grew back."    Discussed the use of AI scribe software for clinical note transcription with the patient, who gave verbal consent to proceed.  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 The discomfort began after she twisted her ankle on a brick walkway while wearing wedge sandals. Initially, the pain was localized to the ankle, but over time, she noticed a callus-like lesion on the bottom of her foot. The lesion disappeared and then reappeared, causing discomfort, especially when walking. The pain is not severe but is progressively worsening and is now constantly noticeable. The patient is concerned about the lesion affecting her upcoming trip to [place].          Objective:    Physical Exam   EXTREMITIES: Feet warm and well perfused with palpable pulses, good capillary fill time. SKIN: Left foot exhibits a benign appearing neoplasm consistent with plantar wart, approximately 5 millimeters in diameter. Presence of lines in the skin disrupted by the lesion. Pain elicited upon lateral compression of the lesion.       Xray of left foot taken today show no foreign body or osseous abnormality in area of concern    Results   Procedure: Debridement of Verruca Plantaris Description: The lesion was debrided and the base layer of the Verruca was exposed. Salinocaine ointment was applied to destroy the lesion and dressed with a bandage.      Assessment:   1. Verruca plantaris      Plan:  Patient was evaluated and treated and all questions answered.  Assessment and Plan    Plantar Wart   She has a painful lesion on her left foot, likely due to HPV.  The lesion was debrided and treated with salicylic acid ointment. She will apply 40-50% strength salicylic acid nightly at home until the lesion resolves, approximately 1 month. We will consider stronger acid treatment (cantharone) if necessary.          Return if symptoms worsen or fail to improve.

## 2023-03-20 ENCOUNTER — Ambulatory Visit (INDEPENDENT_AMBULATORY_CARE_PROVIDER_SITE_OTHER): Payer: BC Managed Care – PPO | Admitting: Medical

## 2023-03-20 ENCOUNTER — Encounter: Payer: Self-pay | Admitting: Medical

## 2023-03-20 VITALS — BP 130/82 | HR 66 | Resp 18 | Ht 64.0 in | Wt 166.6 lb

## 2023-03-20 DIAGNOSIS — E039 Hypothyroidism, unspecified: Secondary | ICD-10-CM | POA: Diagnosis not present

## 2023-03-20 DIAGNOSIS — J309 Allergic rhinitis, unspecified: Secondary | ICD-10-CM | POA: Diagnosis not present

## 2023-03-20 DIAGNOSIS — J45990 Exercise induced bronchospasm: Secondary | ICD-10-CM | POA: Diagnosis not present

## 2023-03-20 DIAGNOSIS — R739 Hyperglycemia, unspecified: Secondary | ICD-10-CM

## 2023-03-20 DIAGNOSIS — J4520 Mild intermittent asthma, uncomplicated: Secondary | ICD-10-CM | POA: Diagnosis not present

## 2023-03-20 DIAGNOSIS — E785 Hyperlipidemia, unspecified: Secondary | ICD-10-CM

## 2023-03-20 NOTE — Progress Notes (Signed)
Subjective:    Patient ID: [name] [name], female    DOB: 1960/11/23, 62 y.o.   MRN: [id number]  HPI  Discussed the use of AI scribe software for clinical note transcription with the patient, who gave verbal consent to proceed.  [employer] practice. None smoker, rare alcohol. Beginning to exercise. Eats moderate healthy. Coffee on weekends.  History of Present Illness   The patient, with a history of hypothyroidism and exercise-induced asthma, has been under the care of an integrative medicine clinic for the past four years. She has been receiving hormone supplementation, including progesterone and estradiol, for post-menopausal symptoms, which has significantly improved her mood and temper.  The patient's asthma, which was previously well-controlled, led to an emergency room visit approximately a month ago. She has a home nebulizer and albuterol, which she uses as needed, particularly before exercise. The patient reports that colds often trigger her asthma by settling in her chest. She has a very light smoking history and suffers from seasonal allergies in the fall and spring, which are managed with daily Singulair and Zyrtec, and occasionally Claritin.  The patient also has a family history of heart disease, with her father having undergone open heart surgery and her brother having suffered a heart attack. She has not been on a statin before due to family history of allergies to the medication. Her blood pressure is typically around 120/70.  The patient's thyroid levels are monitored by her integrative medicine clinic, and she has been taking Singulair daily for allergies. She also takes Zyrtec at night, and occasionally Claritin in the morning if her allergies are particularly bad. She enjoys working in the yard, which can sometimes exacerbate her allergies.            Review of Systems  Constitutional:  Negative for chills, fatigue and fever.  HENT:  Negative for congestion, ear  discharge and facial swelling.   Respiratory:  Negative for chest tightness, shortness of breath and wheezing.   Cardiovascular:  Negative for chest pain and palpitations.  Gastrointestinal:  Negative for abdominal pain.  Genitourinary:  Negative for difficulty urinating, dysuria and genital sores.  Musculoskeletal:  Negative for back pain and joint swelling.  Neurological:  Negative for dizziness, seizures, weakness and headaches.  Hematological:  Negative for adenopathy. Does not bruise/bleed easily.  Psychiatric/Behavioral:  Negative for behavioral problems and confusion.    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  Tobacco Use   Smoking status: Former    Current packs/day: 0.00    Types: Cigarettes    Quit date: 04/1978    Years since quitting: 44.9   Smokeless tobacco: Never  Vaping Use   Vaping status: Never Used  Substance and Sexual Activity   Alcohol use: Yes  Alcohol/week: 2.0 standard drinks of alcohol    Types: 1 Glasses of wine, 1 Standard drinks or equivalent per week    Comment: very rare. social and minimal.   Drug use: No   Sexual activity: Not on file  Other Topics Concern   Not on file  Social History Narrative   Not on file   Social Determinants of Health   Financial Resource Strain: Not on file  Food Insecurity: Not on file  Transportation Needs: Not on file  Physical Activity: Not on  file  Stress: Not on file  Social Connections: Not on file  Intimate Partner Violence: Not on file    Past Surgical History:  Procedure Laterality Date   BLADDER SUSPENSION  2008   CATARACT EXTRACTION, BILATERAL     CESAREAN SECTION  2003   COLONOSCOPY     MANDIBLE SURGERY     POLYPECTOMY     TUBAL LIGATION  2008    Family History  Problem Relation Age of Onset   Colon cancer Father    Colon polyps Father    Breast cancer Paternal Aunt        in 22's   Breast cancer Paternal Aunt        in 87's   Breast cancer Cousin        in 30's   Asthma Other    Allergies Other    Esophageal cancer Neg Hx    Rectal cancer Neg Hx    Stomach cancer Neg Hx     Allergies  Allergen Reactions   Codeine Other (See Comments)    GI Upset   Mirabegron Hives   Moxifloxacin Hives and Other (See Comments)    avenox    Current Outpatient Medications on File Prior to Visit  Medication Sig Dispense Refill   aspirin 81 MG tablet Take 81 mg by mouth daily.     Azelastine-Fluticasone (DYMISTA) 137-50 MCG/ACT SUSP Place 1 spray into the nose 2 (two) times daily. 3 Bottle 4   cetirizine (ZYRTEC) 10 MG tablet Take 10 mg by mouth daily.     Cholecalciferol (VITAMIN D PO) Take by mouth daily.     CLARITIN-D 12 HOUR 5-120 MG per tablet TAKE 1 TABLET BY MOUTH TWICE A DAY 60 tablet 5   clobetasol cream (TEMOVATE) 0.05 % clobetasol 0.05 % topical cream  APPLY A THIN LAYER TO THE AFFECTED AREA(S) BY TOPICAL ROUTE 2 TIMES PER DAY (Patient not taking: Reported on 10/26/2022)     Coenzyme Q10 (COQ10) 30 MG CAPS CoQ10     desonide (DESOWEN) 0.05 % cream Apply topically 2 (two) times daily.     estradiol (CLIMARA - DOSED IN MG/24 HR) 0.075 mg/24hr patch Place 0.075 mg onto the skin 2 (two) times a week.      Fluocinolone Acetonide (DERMOTIC) 0.01 % OIL Place 2 drops into both ears as needed.     Fluticasone-Salmeterol (ADVAIR DISKUS) 100-50 MCG/DOSE AEPB INHALE 1 PUFF INTO LUNGS TWICE A DAY (Patient not  taking: Reported on 10/26/2022) 180 each 4   hydrocortisone (ANUSOL-HC) 25 MG suppository Place 1 suppository (25 mg total) rectally at bedtime. Use nightly for 1 week then every other night 12 suppository 1   levothyroxine (SYNTHROID) 25 MCG tablet Take 25 mcg by mouth daily before breakfast. Takes at bedtime     Magnesium 125 MG CAPS Taurtrate and Glyconate     montelukast (SINGULAIR) 10 MG tablet Take 1 tablet (10 mg total) by mouth at bedtime. 100  tablet 4   Multiple Vitamin (MULTIVITAMIN) tablet Take 1 tablet by mouth daily. (Patient not taking: Reported on 10/26/2022)     Nutritional Supplements (DHEA PO) Take 5 mg by mouth. 3 times per week     Omega-3 Fatty Acids (FISH OIL) 300 MG CAPS Take 300 mg by mouth daily.     oxymetazoline (AFRIN NASAL SPRAY) 0.05 % nasal spray Place 2 sprays into the nose 2 (two) times daily.       predniSONE (DELTASONE) 10 MG tablet Take 4 tablets (40 mg total) by mouth daily. 20 tablet 0   progesterone (PROMETRIUM) 100 MG capsule Take total of 4 tablets QD  3   Thyroid (NATURE-THROID) 16.25 MG TABS Takes in AM     tolterodine (DETROL LA) 4 MG 24 hr capsule Take 1 capsule every day by oral route.     No current facility-administered medications on file prior to visit.    BP 130/82   Pulse 66   Resp 18   Ht 5\' 4"  (1.626 m)   Wt 166 lb 9.6 oz (75.6 kg)   LMP 02/16/2012   SpO2 100%   BMI 28.60 kg/m        Objective:   Physical Exam  General Mental Status- Alert. General Appearance- Not in acute distress.   Skin General: Color- Normal Color. Moisture- Normal Moisture.  Neck Carotid Arteries- Normal color. Moisture- Normal Moisture. No carotid bruits. No JVD.  Chest and Lung Exam Auscultation: Breath Sounds:-Normal.  Cardiovascular Auscultation:Rythm- Regular. Murmurs & Other Heart Sounds:Auscultation of the heart reveals- No Murmurs.  Abdomen Inspection:-Inspeection Normal. Palpation/Percussion:Note:No mass. Palpation and Percussion of  the abdomen reveal- Non Tender, Non Distended + BS, no rebound or guarding.   Neurologic Cranial Nerve exam:- CN III-XII intact(No nystagmus), symmetric smile. Strength:- 5/5 equal and symmetric strength both upper and lower extremities.       Assessment & Plan:   Patient Instructions  Hypothyroidism Stable on current treatment regimen. -levothyroxine 25 mcg daily.  Hormone Replacement Therapy (HRT) [facility] integrative Improved mood with progesterone and estradiol for post-menopausal symptoms. -Continue current HRT regimen.  Exercise Induced Asthma Recent exacerbation requiring emergency department visit. Currently has albuterol inhaler. -Continue use of albuterol as needed. -Consider Advair inhaler for flare-ups, patient to contact office if albuterol use increases.  Seasonal Allergies Managed with Singulair and Zyrtec, occasional use of Claritin. -Continue current regimen. -Consider nasal saline spray during yard work.  Hyperlipidemia Family history of heart disease, recent lipid panel results pending. -Consider/offered referral to cardiology for further evaluation and potential statin therapy or other option once lipid panel results are reviewed. Declined today. if you change your mind let me know.  General Health Maintenance -Declined Shingrix vaccine. -Did not receive flu vaccine this year. -if you want tdap done later let us know.   Follow up date to be determined after lab review. Possibly in 6 months or sooner if needed       [employer], [employer]

## 2023-03-20 NOTE — Patient Instructions (Addendum)
Hypothyroidism Stable on current treatment regimen. -levothyroxine 25 mcg daily.  Hormone Replacement Therapy (HRT) [facility] integrative Improved mood with progesterone and estradiol for post-menopausal symptoms. -Continue current HRT regimen.  Exercise Induced Asthma Recent exacerbation requiring emergency department visit. Currently has albuterol inhaler. -Continue use of albuterol as needed. -Consider Advair inhaler for flare-ups, patient to contact office if albuterol use increases.  Seasonal Allergies Managed with Singulair and Zyrtec, occasional use of Claritin. -Continue current regimen. -Consider nasal saline spray during yard work.  Hyperlipidemia Family history of heart disease, recent lipid panel results pending. -Consider/offered referral to cardiology for further evaluation and potential statin therapy or other option once lipid panel results are reviewed. Declined today. if you change your mind let me know.  General Health Maintenance -Declined Shingrix vaccine. -Did not receive flu vaccine this year. -if you want tdap done later let us know.   Follow up date to be determined after lab review. Possibly in 6 months or sooner if needed

## 2023-06-29 IMAGING — MG MM DIGITAL SCREENING BILAT W/ TOMO AND CAD
8 series · 8 of 24 positions shown · non-contrast
Comparison: Previous exam(s).

CLINICAL DATA: Screening.

EXAM:
DIGITAL SCREENING BILATERAL MAMMOGRAM WITH TOMOSYNTHESIS AND CAD
TECHNIQUE: Bilateral screening digital craniocaudal and mediolateral oblique
mammograms were obtained. Bilateral screening digital breast
tomosynthesis was performed. The images were evaluated with
computer-aided detection.

[L MLO synth-2D]
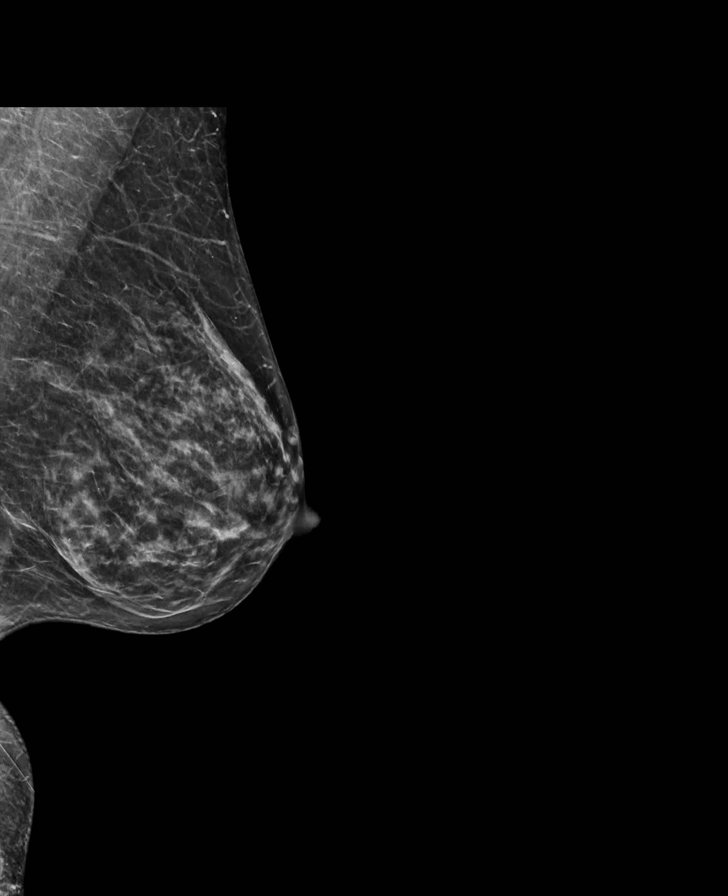

[R CC synth-2D]
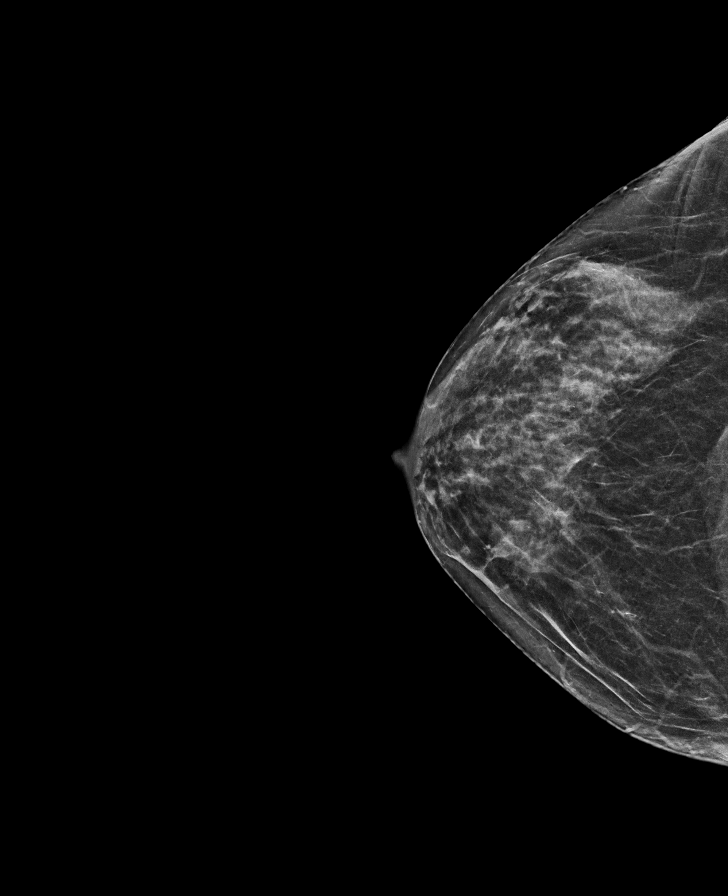

[R MLO synth-2D]
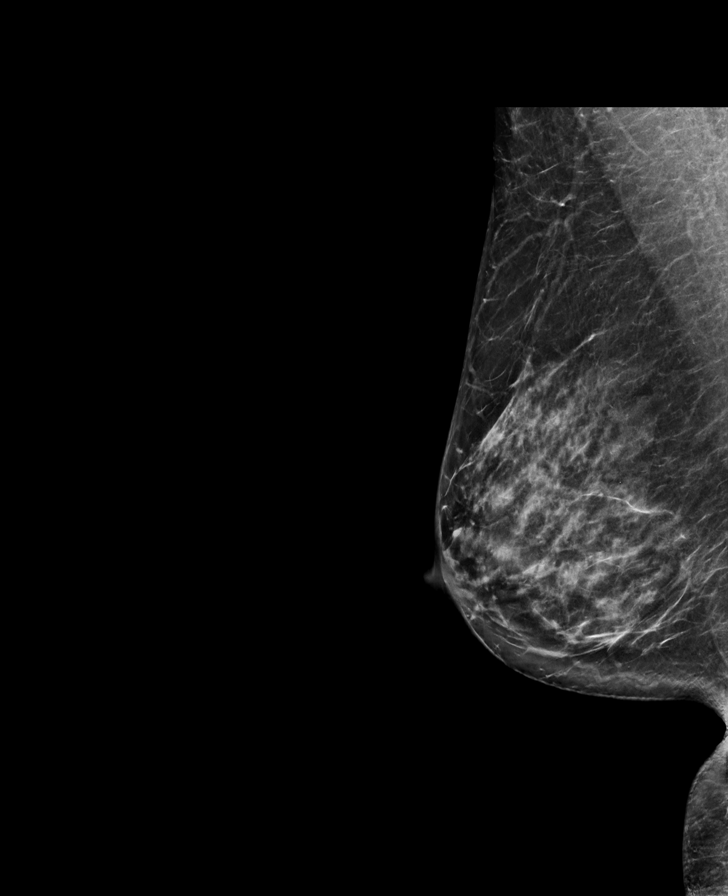

[L CC synth-2D]
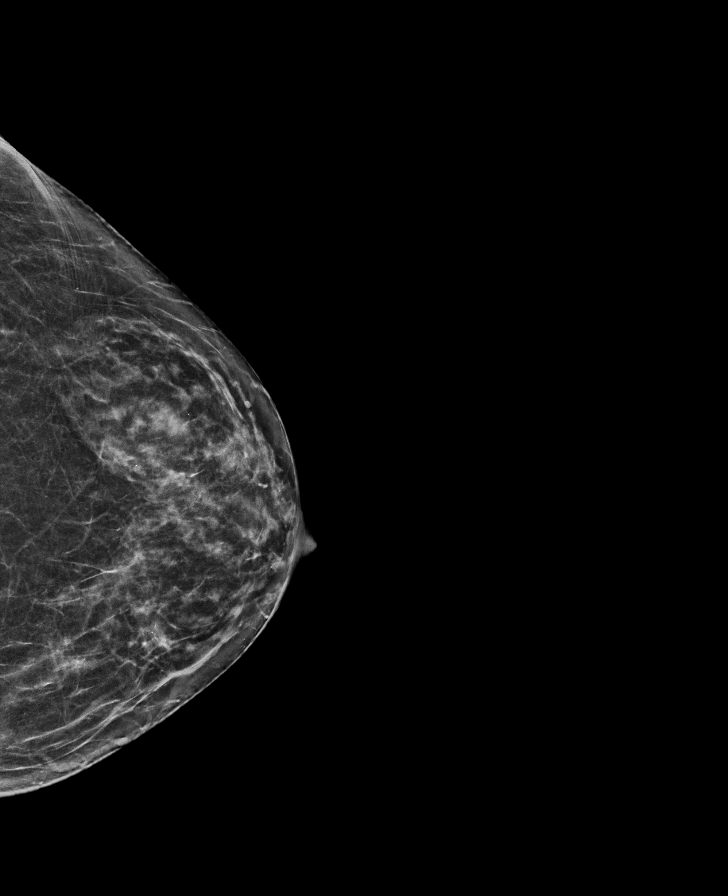

[L MLO tomo · tomo slice 33/64.0]
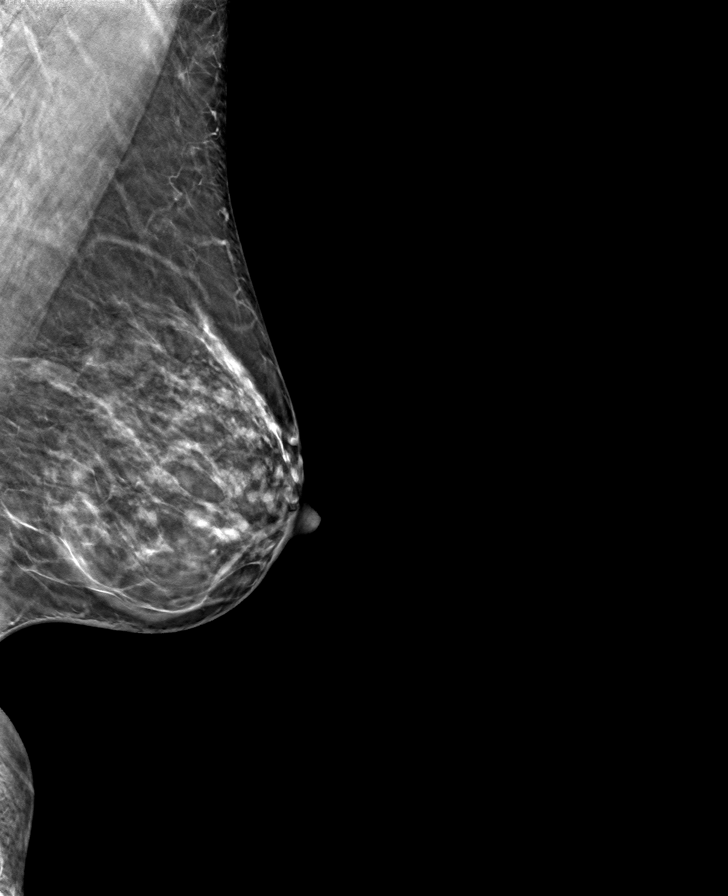

[R MLO tomo · tomo slice 35/70.0]
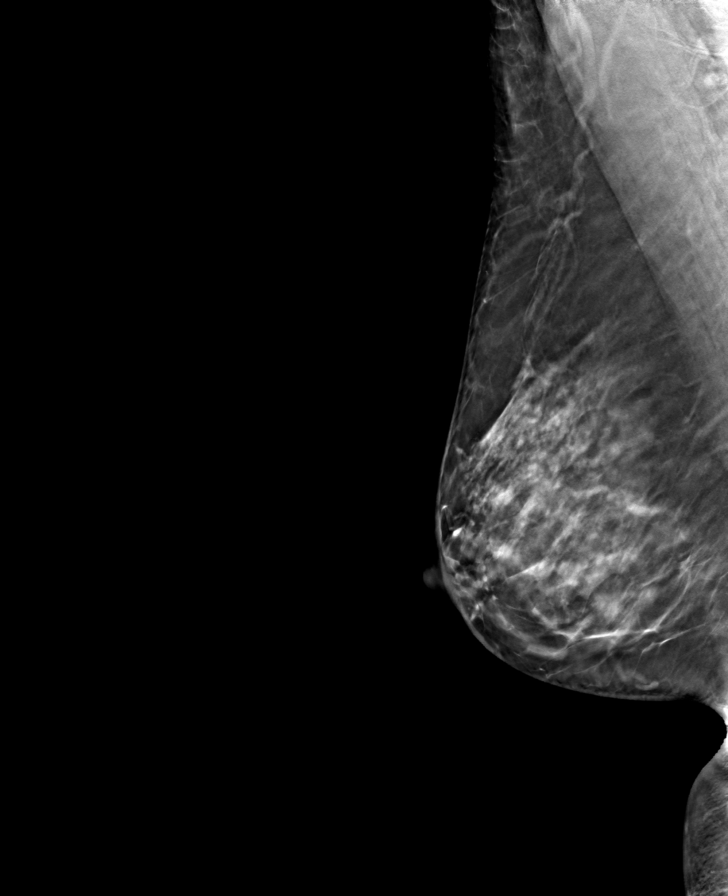

[R CC tomo · tomo slice 30/59.0]
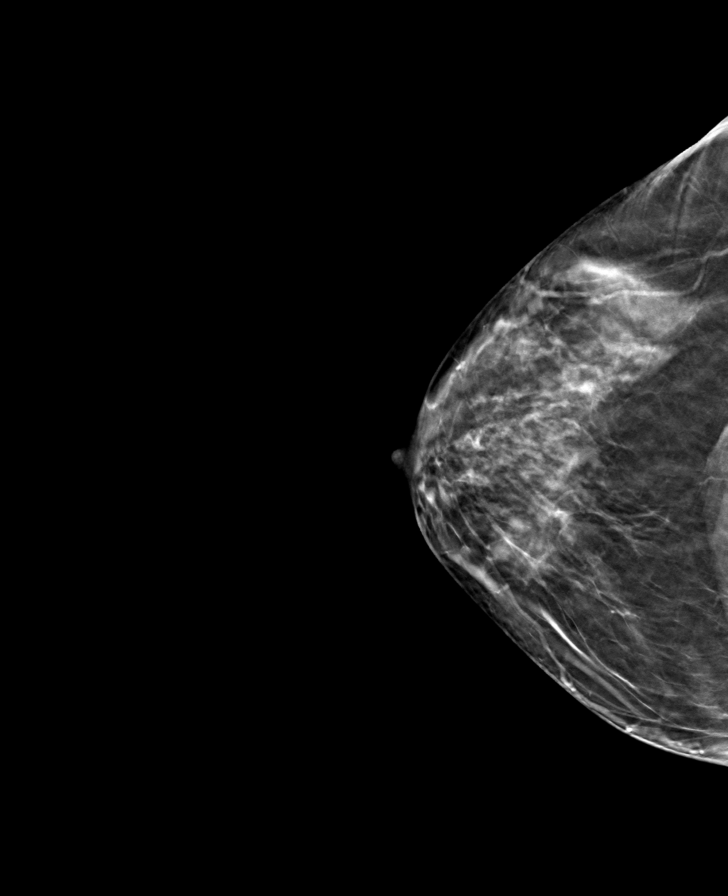

[L CC tomo · tomo slice 31/60.0]
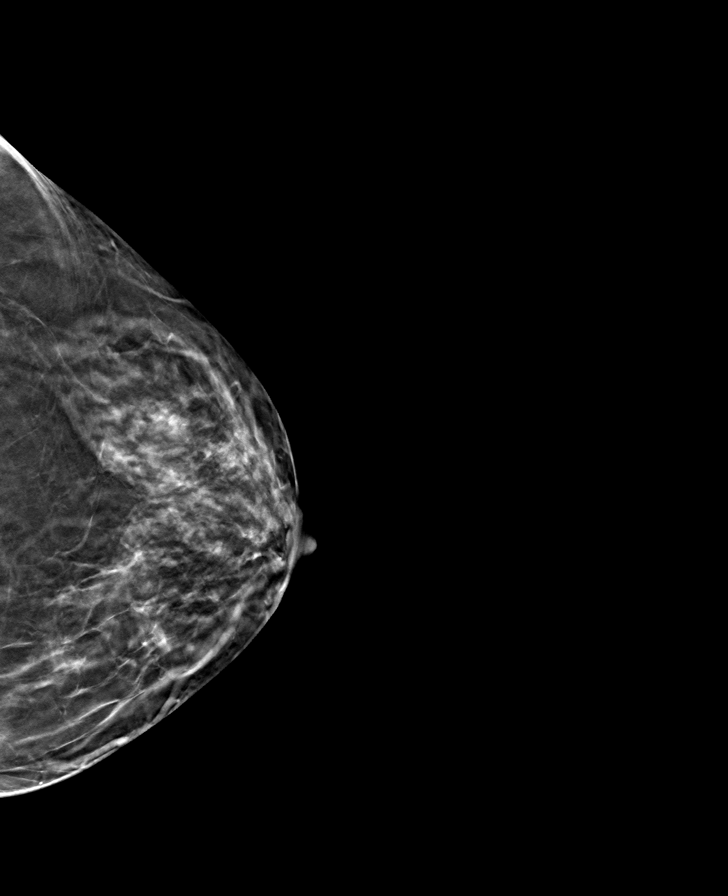

[8 of 24 positions shown; findings below may reference images not displayed]

ACR Breast Density Category c: The breast tissue is heterogeneously
dense, which may obscure small masses.
FINDINGS: There are no findings suspicious for malignancy.
IMPRESSION: No mammographic evidence of malignancy. A result letter of this
screening mammogram will be mailed directly to the patient.

RECOMMENDATION:
Screening mammogram in one year. (Code:Q3-W-BC3)

BI-RADS CATEGORY  1: Negative.

## 2023-10-11 ENCOUNTER — Ambulatory Visit

## 2023-10-11 ENCOUNTER — Encounter: Payer: Self-pay | Admitting: Emergency Medicine

## 2023-10-11 ENCOUNTER — Ambulatory Visit: Payer: Self-pay | Admitting: Family Medicine

## 2023-10-11 ENCOUNTER — Ambulatory Visit
Admission: EM | Admit: 2023-10-11 | Discharge: 2023-10-11 | Disposition: A | Discharge status: Home / Self Care | Attending: Family Medicine | Admitting: Family Medicine

## 2023-10-11 DIAGNOSIS — S52124A Nondisplaced fracture of head of right radius, initial encounter for closed fracture: Secondary | ICD-10-CM

## 2023-10-11 DIAGNOSIS — M25521 Pain in right elbow: Secondary | ICD-10-CM | POA: Diagnosis not present

## 2023-10-11 MED ORDER — IBUPROFEN 800 MG PO TABS: 800.0000 mg | ORAL_TABLET | Freq: Three times a day (TID) | ORAL | 0 refills | Status: AC

## 2023-10-11 NOTE — ED Triage Notes (Signed)
 Patient states that she fell today injuring her right elbow while walking.  Patient has taken Ibuprofen  and applied ice.  Unable to straighten right arm/elbow.

## 2023-10-11 NOTE — ED Provider Notes (Signed)
 [name]    CSN: [id number] Arrival date & time: 10/11/23  1430      History   Chief Complaint Chief Complaint  Patient presents with   Elbow Pain    HPI [name] [name] is a 63 y.o. female.   HPI Patient presents today for evaluation of an injury to the right elbow after a fall she sustained earlier today.  Patient reports she was walking in her neighborhood lost her food and they will and subsequently rolled.  She initially at her wrist however she has full mobility without pain involving the wrist and most of her pain is in the right elbow and she has minimal movement since injury occurred.  Denies any prior injuries involving the right elbow.  Patient's sensation is intact.  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/14/2013   History of elevated lipids 04/14/2013   Hearing loss 01/01/2012   SEBORRHEIC KERATOSIS, INFLAMED 03/20/2010   Headache 12/17/2008   PERIMENOPAUSAL STATUS 12/17/2008   SKIN RASH 06/28/2008   DYSFUNCTIONAL UTERINE BLEEDING 11/19/2007   DEGENERATIVE DISC DISEASE, CERVICAL SPINE, 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      Home Medications    Prior to Admission medications   Medication Sig Start Date End Date Taking? Authorizing Provider  aspirin 81 MG tablet Take 81 mg by mouth daily.   Yes [provider]  Azelastine -Fluticasone  (DYMISTA ) 137-50 MCG/ACT SUSP Place 1 spray into the nose 2 (two) times daily. 12/11/17  Yes Krystal Reyes LABOR, MD  cetirizine  (ZYRTEC ) 10 MG tablet Take 10 mg by mouth daily. 12/28/10  Yes Young, Reggy D, MD  Cholecalciferol (VITAMIN D PO) Take by mouth daily.   Yes [provider]  CLARITIN -D 12 HOUR 5-120 MG per tablet TAKE 1 TABLET BY MOUTH TWICE A DAY 11/04/12  Yes Young, Clinton D, MD  Coenzyme Q10 (COQ10) 30 MG CAPS CoQ10   Yes [provider]  desonide (DESOWEN) 0.05 % cream Apply topically 2 (two) times daily.   Yes [provider]  estradiol  (CLIMARA  - DOSED IN MG/24 HR) 0.075 mg/24hr patch Place 0.075 mg onto the skin 2 (two) times a week.    Yes [provider]  Fluocinolone Acetonide (DERMOTIC) 0.01 % OIL Place 2 drops into both ears as needed.   Yes [provider]  ibuprofen  (ADVIL ) 800 MG tablet Take 1 tablet (800 mg total) by mouth  3 (three) times daily. 10/11/23  Yes Arloa Suzen RAMAN, NP  levothyroxine  (SYNTHROID ) 25 MCG tablet Take 25 mcg by mouth daily before breakfast. Takes at bedtime   Yes [provider]  Magnesium 125 MG CAPS Taurtrate and Glyconate   Yes [provider]  montelukast  (SINGULAIR ) 10 MG tablet Take 1 tablet (10 mg total) by mouth at bedtime. 12/11/17  Yes Krystal Reyes LABOR, MD  Nutritional Supplements (DHEA PO) Take 5 mg by mouth. 3 times per week   Yes [provider]  Omega-3 Fatty Acids (FISH OIL) 300 MG CAPS Take 300 mg by mouth daily.   Yes [provider]  oxymetazoline (AFRIN NASAL SPRAY) 0.05 % nasal spray Place 2 sprays into the nose 2 (two) times daily.     Yes [provider]   progesterone  (PROMETRIUM ) 100 MG capsule Take total of 4 tablets QD 03/29/16  Yes [provider]  Thyroid  (NATURE-THROID) 16.25 MG TABS Takes in AM   Yes [provider]  tolterodine (DETROL LA) 4 MG 24 hr capsule Take 1 capsule every day by oral route. 08/22/22  Yes [provider]  clobetasol cream (TEMOVATE) 0.05 % clobetasol 0.05 % topical cream  APPLY A THIN LAYER TO THE AFFECTED AREA(S) BY TOPICAL ROUTE 2 TIMES PER DAY Patient not taking: Reported on 10/26/2022    [provider]  Fluticasone -Salmeterol (ADVAIR DISKUS) 100-50 MCG/DOSE AEPB INHALE 1 PUFF INTO LUNGS TWICE A DAY Patient not taking: Reported on 10/26/2022 12/11/17   Krystal Reyes LABOR, MD  hydrocortisone  (ANUSOL -HC) 25 MG suppository Place 1 suppository (25 mg total) rectally at bedtime. Use nightly for 1 week then every other night 11/26/22   Mansouraty, Gabriel Jr., MD  Multiple Vitamin (MULTIVITAMIN) tablet Take 1 tablet by mouth daily. Patient not taking: Reported on 10/26/2022    [provider]  predniSONE  (DELTASONE ) 10 MG tablet Take 4 tablets (40 mg total) by mouth daily. 12/31/22   Zackowski, Scott, MD    Family History Family History  Problem Relation Age of Onset   Colon cancer Father    Colon polyps Father    Breast cancer Paternal Aunt        in 66's   Breast cancer Paternal Aunt        in 60's   Breast cancer Cousin        in 30's   Asthma Other    Allergies Other    Esophageal cancer Neg Hx    Rectal cancer Neg Hx    Stomach cancer Neg Hx     Social History Social History   Tobacco Use   Smoking status: Former    Current packs/day: 0.00    Types: Cigarettes    Quit date: 04/1978    Years since quitting: 45.5   Smokeless tobacco: Never  Vaping Use   Vaping status: Never Used  Substance Use Topics   Alcohol use: Yes    Alcohol/week: 2.0 standard drinks of alcohol    Types: 1 Glasses of wine, 1 Standard drinks or equivalent per week    Comment: very  rare. social and minimal.   Drug use: No     Allergies   Codeine, Mirabegron, and Moxifloxacin   Review of Systems Review of Systems  Musculoskeletal:  Positive for arthralgias and joint swelling.     Physical Exam Triage Vital Signs ED Triage Vitals  Encounter Vitals Group     BP 10/11/23 1444 (!) 160/94     Girls Systolic BP  Percentile --      Girls Diastolic BP Percentile --      Boys Systolic BP Percentile --      Boys Diastolic BP Percentile --      Pulse Rate 10/11/23 1444 75     Resp 10/11/23 1444 18     Temp 10/11/23 1444 98.4 F (36.9 C)     Temp Source 10/11/23 1444 Oral     SpO2 10/11/23 1444 93 %     Weight 10/11/23 1446 160 lb (72.6 kg)     Height 10/11/23 1446 5' 5 (1.651 m)     Head Circumference --      Peak Flow --      Pain Score 10/11/23 1446 2     Pain Loc --      Pain Education --      Exclude from Growth Chart --    No data found.  Updated Vital Signs BP (!) 160/94 (BP Location: Left Arm)   Pulse 75   Temp 98.4 F (36.9 C) (Oral)   Resp 18   Ht 5' 5 (1.651 m)   Wt 160 lb (72.6 kg)   LMP 02/16/2012   SpO2 93%   BMI 26.63 kg/m   Visual Acuity Right Eye Distance:   Left Eye Distance:   Bilateral Distance:    Right Eye Near:   Left Eye Near:    Bilateral Near:     Physical Exam Vitals reviewed.  Constitutional:      Appearance: Normal appearance.  HENT:     Head: Normocephalic and atraumatic.  Eyes:     Extraocular Movements: Extraocular movements intact.     Pupils: Pupils are equal, round, and reactive to light.  Cardiovascular:     Rate and Rhythm: Normal rate and regular rhythm.  Pulmonary:     Effort: Pulmonary effort is normal.  Musculoskeletal:     Right elbow: Swelling present. Decreased range of motion. Tenderness present in radial head, medial epicondyle and lateral epicondyle.     Left elbow: Normal.     Cervical back: Normal range of motion and neck supple.  Skin:    General: Skin is warm and dry.      Capillary Refill: Capillary refill takes less than 2 seconds.  Neurological:     General: No focal deficit present.     Mental Status: She is alert.      UC Treatments / Results  Labs (all labs ordered are listed, but only abnormal results are displayed) Labs Reviewed - No data to display  EKG   Radiology DG Elbow Complete Right Result Date: 10/11/2023 CLINICAL DATA:  right elbow pain Posterior elbow pain with limited range of motion after falling today. EXAM: RIGHT ELBOW - COMPLETE 3+ VIEW COMPARISON:  None Available. FINDINGS: The mineralization and alignment are normal. There is a nondisplaced intra-articular fracture of the radial head which is best seen on the lateral view. Associated moderate-sized elbow joint effusion. Mild underlying spurring of the coronoid process without definite intra-articular loose body. No foreign bodies or soft tissue emphysema. IMPRESSION: Nondisplaced intra-articular fracture of the radial head with associated elbow joint effusion. Electronically Signed   By: Elsie Perone M.D.   On: 10/11/2023 15:14    Procedures Procedures (including critical care time)  Medications Ordered in UC Medications - No data to display  Initial Impression / Assessment and Plan / UC Course  I have reviewed the triage vital signs and the nursing notes.  Pertinent labs &  imaging results that were available during my care of the patient were reviewed by me and considered in my medical decision making (see chart for details).    Nondisplaced intra-articular fracture involving the right ear head with joint effusion on x-ray.  Patient placed in a posterior long-arm splint.  [facility] already established with [facility] advised to call them on Monday to schedule a follow-up appointment for further evaluation of injury.  For pain ibuprofen  800 mg patient has normal renal functioning.  Patient verbalized understanding and agreement with plan. Final Clinical  Impressions(s) / UC Diagnoses   Final diagnoses:  Closed nondisplaced fracture of head of right radius, initial encounter     Discharge Instructions      As discussed called [facility] and schedule follow-up with your orthopedic provider for Monday.  Let them know that you have a temporary splint in place stabilized right radial head fracture.     ED Prescriptions     Medication Sig Dispense Auth. Provider   ibuprofen  (ADVIL ) 800 MG tablet Take 1 tablet (800 mg total) by mouth 3 (three) times daily. 21 tablet Arloa Suzen RAMAN, NP      PDMP not reviewed this encounter.   Arloa Suzen RAMAN, NP 10/11/23 [phone]

## 2023-10-11 NOTE — Discharge Instructions (Addendum)
 As discussed called [facility] and schedule follow-up with your orthopedic provider for Monday.  Let them know that you have a temporary splint in place stabilized right radial head fracture.

## 2023-10-11 NOTE — ED Notes (Signed)
 Pt splinted in posterior long arm splint, neurovascular intact. Crt <3 secs

## 2023-12-24 ENCOUNTER — Other Ambulatory Visit: Payer: Self-pay | Admitting: Obstetrics and Gynecology

## 2023-12-24 DIAGNOSIS — Z1231 Encounter for screening mammogram for malignant neoplasm of breast: Secondary | ICD-10-CM

## 2024-01-21 ENCOUNTER — Ambulatory Visit

## 2024-02-11 ENCOUNTER — Ambulatory Visit
Admission: RE | Admit: 2024-02-11 | Discharge: 2024-02-11 | Disposition: A | Source: Ambulatory Visit | Attending: Obstetrics and Gynecology | Admitting: Obstetrics and Gynecology

## 2024-02-11 DIAGNOSIS — Z1231 Encounter for screening mammogram for malignant neoplasm of breast: Secondary | ICD-10-CM
